# Patient Record
Sex: Male | Born: 1960 | Race: Black or African American | Hispanic: No | Marital: Married | Smoking: Current some day smoker
Health system: Southern US, Community
[De-identification: ages and names within clinical notes are randomized; demographics above are authoritative.]

## PROBLEM LIST (undated history)

## (undated) DIAGNOSIS — T7840XA Allergy, unspecified, initial encounter: Secondary | ICD-10-CM

## (undated) DIAGNOSIS — K219 Gastro-esophageal reflux disease without esophagitis: Secondary | ICD-10-CM

## (undated) DIAGNOSIS — E785 Hyperlipidemia, unspecified: Secondary | ICD-10-CM

## (undated) HISTORY — PX: FOOT SURGERY: SHX648

## (undated) HISTORY — DX: Gastro-esophageal reflux disease without esophagitis: K21.9

## (undated) HISTORY — PX: HAND SURGERY: SHX662

## (undated) HISTORY — DX: Allergy, unspecified, initial encounter: T78.40XA

## (undated) HISTORY — PX: OTHER SURGICAL HISTORY: SHX169

## (undated) HISTORY — DX: Hyperlipidemia, unspecified: E78.5

---

## 2005-06-15 ENCOUNTER — Ambulatory Visit: Payer: Self-pay | Admitting: Nurse Practitioner

## 2005-06-20 ENCOUNTER — Ambulatory Visit (HOSPITAL_COMMUNITY): Admission: RE | Admit: 2005-06-20 | Discharge: 2005-06-20 | Payer: Self-pay | Admitting: Nurse Practitioner

## 2005-06-22 ENCOUNTER — Ambulatory Visit: Payer: Self-pay | Admitting: Nurse Practitioner

## 2005-07-05 ENCOUNTER — Ambulatory Visit: Payer: Self-pay | Admitting: Nurse Practitioner

## 2005-07-20 ENCOUNTER — Encounter: Admission: RE | Admit: 2005-07-20 | Discharge: 2005-10-12 | Payer: Self-pay | Admitting: Family Medicine

## 2005-10-11 ENCOUNTER — Ambulatory Visit: Payer: Self-pay | Admitting: Nurse Practitioner

## 2005-10-28 ENCOUNTER — Ambulatory Visit: Payer: Self-pay | Admitting: Nurse Practitioner

## 2006-03-31 ENCOUNTER — Ambulatory Visit: Payer: Self-pay | Admitting: Nurse Practitioner

## 2006-06-27 ENCOUNTER — Ambulatory Visit: Payer: Self-pay | Admitting: Internal Medicine

## 2006-12-15 ENCOUNTER — Encounter (INDEPENDENT_AMBULATORY_CARE_PROVIDER_SITE_OTHER): Payer: Self-pay | Admitting: Nurse Practitioner

## 2006-12-15 ENCOUNTER — Ambulatory Visit: Payer: Self-pay | Admitting: Family Medicine

## 2006-12-15 LAB — CONVERTED CEMR LAB
ALT: 18 units/L (ref 0–53)
AST: 19 units/L (ref 0–37)
Albumin: 4.3 g/dL (ref 3.5–5.2)
Alkaline Phosphatase: 114 units/L (ref 39–117)
BUN: 12 mg/dL (ref 6–23)
Basophils Absolute: 0.1 10*3/uL (ref 0.0–0.1)
Basophils Relative: 1 % (ref 0–1)
CO2: 24 meq/L (ref 19–32)
Calcium: 9.6 mg/dL (ref 8.4–10.5)
Chloride: 104 meq/L (ref 96–112)
Creatinine, Ser: 1.27 mg/dL (ref 0.40–1.50)
Eosinophils Absolute: 0.2 10*3/uL (ref 0.2–0.7)
Eosinophils Relative: 3 % (ref 0–5)
Glucose, Bld: 90 mg/dL (ref 70–99)
HCT: 43.8 % (ref 39.0–52.0)
Hemoglobin: 14.6 g/dL (ref 13.0–17.0)
Lymphocytes Relative: 30 % (ref 12–46)
Lymphs Abs: 2.4 10*3/uL (ref 0.7–4.0)
MCHC: 33.3 g/dL (ref 30.0–36.0)
MCV: 76.6 fL — ABNORMAL LOW (ref 78.0–100.0)
Monocytes Absolute: 0.8 10*3/uL (ref 0.1–1.0)
Monocytes Relative: 11 % (ref 3–12)
Neutro Abs: 4.4 10*3/uL (ref 1.7–7.7)
Neutrophils Relative %: 55 % (ref 43–77)
Platelets: 318 10*3/uL (ref 150–400)
Potassium: 4.5 meq/L (ref 3.5–5.3)
RBC: 5.72 M/uL (ref 4.22–5.81)
RDW: 15.2 % (ref 11.5–15.5)
Sodium: 139 meq/L (ref 135–145)
TSH: 1.462 microintl units/mL (ref 0.350–5.50)
Total Bilirubin: 0.5 mg/dL (ref 0.3–1.2)
Total Protein: 7.3 g/dL (ref 6.0–8.3)
WBC: 7.9 10*3/uL (ref 4.0–10.5)

## 2007-03-21 ENCOUNTER — Ambulatory Visit: Payer: Self-pay | Admitting: Family Medicine

## 2007-08-02 ENCOUNTER — Ambulatory Visit: Payer: Self-pay | Admitting: Internal Medicine

## 2008-01-16 ENCOUNTER — Ambulatory Visit: Payer: Self-pay | Admitting: Internal Medicine

## 2008-03-12 ENCOUNTER — Encounter
Admission: RE | Admit: 2008-03-12 | Discharge: 2008-03-12 | Payer: Self-pay | Admitting: Physical Medicine & Rehabilitation

## 2008-04-29 ENCOUNTER — Ambulatory Visit: Payer: Self-pay | Admitting: Internal Medicine

## 2009-01-01 ENCOUNTER — Ambulatory Visit: Payer: Self-pay | Admitting: Family Medicine

## 2010-01-25 ENCOUNTER — Encounter: Payer: Self-pay | Admitting: Internal Medicine

## 2012-03-14 ENCOUNTER — Emergency Department (INDEPENDENT_AMBULATORY_CARE_PROVIDER_SITE_OTHER): Payer: Medicare Other

## 2012-03-14 ENCOUNTER — Encounter (HOSPITAL_COMMUNITY): Payer: Self-pay | Admitting: Emergency Medicine

## 2012-03-14 ENCOUNTER — Emergency Department (INDEPENDENT_AMBULATORY_CARE_PROVIDER_SITE_OTHER)
Admission: EM | Admit: 2012-03-14 | Discharge: 2012-03-14 | Disposition: A | Discharge status: Home / Self Care | Payer: Medicare Other | Source: Home / Self Care | Attending: Family Medicine | Admitting: Family Medicine

## 2012-03-14 DIAGNOSIS — S63259A Unspecified dislocation of unspecified finger, initial encounter: Secondary | ICD-10-CM

## 2012-03-14 DIAGNOSIS — S63105A Unspecified dislocation of left thumb, initial encounter: Secondary | ICD-10-CM

## 2012-03-14 MED ORDER — OXYCODONE HCL 5 MG PO TABS: 5.0000 mg | ORAL_TABLET | Freq: Three times a day (TID) | ORAL | Status: AC | PRN

## 2012-03-14 NOTE — ED Provider Notes (Signed)
History     CSN: [id number]  Arrival date & time 03/14/12  1602   First MD Initiated Contact with Patient 03/14/12 1644      Chief Complaint  Patient presents with  . Hand Injury    injury to left thumb about an hour ago when sawing tree limb    (Consider location/radiation/quality/duration/timing/severity/associated sxs/prior treatment) Patient is a 52 y.o. male presenting with hand injury. The history is provided by the patient.  Hand Injury Location:  Finger Time since incident:  2 hours Injury: yes   Mechanism of injury comment:  Hyperext injury to left thumb from tree limb when sawing. Finger location:  L thumb Pain details:    Severity:  Mild   Progression:  Unchanged Chronicity:  New   History reviewed. No pertinent past medical history.  Past Surgical History  Procedure Laterality Date  . Foot surgery      History reviewed. No pertinent family history.  History  Substance Use Topics  . Smoking status: Current Every Day Smoker -- 1.00 packs/day    Types: Cigarettes  . Smokeless tobacco: Not on file  . Alcohol Use: Yes      Review of Systems  Constitutional: Negative.   Musculoskeletal: Positive for joint swelling.  Skin: Negative.     Allergies  Review of patient's allergies indicates no known allergies.  Home Medications  No current outpatient prescriptions on file.  BP 135/82  Pulse 88  Temp(Src) 98.5 F (36.9 C) (Oral)  Resp 20  SpO2 97%  Physical Exam  Nursing note and vitals reviewed. Constitutional: He is oriented to person, place, and time. He appears well-developed and well-nourished.  Musculoskeletal: He exhibits tenderness.       Hands: Neurological: He is alert and oriented to person, place, and time.  Skin: Skin is warm and dry.    ED Course  Procedures (including critical care time)  Labs Reviewed - No data to display Dg Finger Thumb Left  03/14/2012  *RADIOLOGY REPORT*  Clinical Data: Pain post trauma  LEFT THUMB  2+V  Comparison: None.  Findings:  Frontal, oblique, and lateral views were obtained. There is dislocation at the first MCP joint with the proximal and distal phalanges displaced volar and slightly lateral to the first metacarpal.  No fractures appreciated.  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  IMPRESSION: Dislocation of the first MCP joint. No fracture appreciable.   Original Report Authenticated By: Bretta Bang, M.D.      1. Dislocation of thumb, left, initial encounter       MDM  X-rays reviewed and report per radiologist.         Linna Hoff, MD 03/14/12 [phone]

## 2012-03-14 NOTE — ED Notes (Signed)
Placed patient in lobby-waiting for dr Amanda Pea to arrive

## 2012-03-14 NOTE — ED Notes (Signed)
Pt c/o injury to left thumb. Pt states that when sawing a limb another limb swung back and hit into left thumb. Unable to move. Swelling noted. Pt ? If it's dislocated. Incident happened an hour ago.

## 2012-03-14 NOTE — ED Notes (Signed)
Injected with Sensoicaine 0.5% and Xylocaine 2 % prior to reduction. Thumb spica splint applied by Dr. [place] Denmark and his PA with plaster casting.

## 2012-03-14 NOTE — Consult Note (Signed)
  See dictation#[id number] Dominica Severin MD

## 2012-03-15 NOTE — Consult Note (Signed)
NAMECORDARIOUS, Corey Jenkins NO.:  [id number]  MEDICAL RECORD NO.:  [id number]  LOCATION:  [facility]                         FACILITY:  [facility]  PHYSICIAN:  Dionne Ano. Gramig, M.D.DATE OF BIRTH:  09/21/1960  DATE OF CONSULTATION: DATE OF DISCHARGE:  03/14/2012                                CONSULTATION   I had the pleasure to see Corey Jenkins.  He is a pleasant gentleman who presents for evaluation and treatment of upper extremity debridement. Corey Jenkins is doing some hand-held work and subsequently sustained an injury to his left thumb.  The patient has not had discussed this issues at length.  At the present juncture, the patient had a significantly dislocated  left thumb.  He complained a severe pain, he notes moderate distress.  We have reviewed his past medical and surgical history at great length as dilated in the chart.  The patient notes that he has some degree of prior injury to the thumb and does always lay at normally he states.  He denies shortness of breath, nausea, vomiting, fever, or chills.  At present time, the patient is evaluated.  Then, he is noted to be alert and oriented and in no acute distress.  The patient states he has no significant allergies.  His flow sheet, chart and review of systems as well detailed and I have reviewed this at length today.  I should note that he has a past surgical history of foot surgery.  He has no pertinent family history.  He occasionally use alcohol.  He smokes a pack a day and he is a current smoker.  Review of systems is negative other than the thumb.  Skin is negative. The patient has no medicines.  He denies elicit drug use.  PHYSICAL EXAMINATION:  GENERAL:  He is a pleasant male, alert and oriented, in no acute distress. VITAL SIGNS:  Stable. HEENT:  The patient has normal HEENT examination. CHEST:  Clear. EXTREMITIES:  Lower extremity examination is benign.  No evidence of DVT, infection, or dystrophy.  He  has right upper extremity, which is neurovascularly intact.  Deep tendon reflexes throughout the biceps, triceps, and brachioradials.  The patient states that he has normal sensation and refill.  He was found to have an obvious dislocation about the MCP joint, left thumb with significant pain on palpation.  I have reviewed this at length and its findings.  X-rays showed displaced MCP joint, left thumb.  He was given a verbal consent following this intermetacarpal block followed by reduction.  Once he was reduced, postreduction x-rays showed that he had adequate relocation of the thumb.  However, examination under anesthesia revealed a complete ulnar collateral ligament tear as well as block with capsular injury extensive in nature, I pointed this after the patient.  The patient and I discussed the relevant issues, do's and don'ts, and options as well as plans for him.  Following this, he was placed in a splint.  Thus, he underwent reduction, dislocation, __________ evaluation.  We are going to plan for give surgical stabilization in the ulnar collateral ligament with pinning and capture reconstruction as necessary.  He understands the risks and benefits of surgery  including risk of infection, bleeding, anesthesia, damage to normal structures and failure of surgery to accomplish its intended goals of relieving symptoms and restoring function.  With this in mind, he desires to proceed.  We will proceed with __________ and should any problems arise, he will notify us.  It was an absolute pleasure to see him today.  Should any problems arise, he will notify us.  We would like to see him back in the operative theater as discussed.  As the symptoms discussed with the patient and the risks, benefits, do's and don'ts and the fact __________ to hopefully give him a finger to somewhat stiff once it is stable as right now he has a very unstable thumb with significant abnormalities.  It  was an absolute pleasure to see him today.  Do's and don'ts have been discussed and all of his questions were answered.  He left the emergency room in awake, alert and oriented and had no complicating features and is ready to proceed with reconstruction.     Dionne Ano. Amanda Pea, M.D.     [facility]  D:  03/14/2012  T:  03/15/2012  Job:  [id number]

## 2013-04-10 ENCOUNTER — Ambulatory Visit (INDEPENDENT_AMBULATORY_CARE_PROVIDER_SITE_OTHER): Payer: Medicare Other | Admitting: Surgery

## 2013-04-10 ENCOUNTER — Encounter (INDEPENDENT_AMBULATORY_CARE_PROVIDER_SITE_OTHER): Payer: Self-pay

## 2013-04-10 ENCOUNTER — Encounter (INDEPENDENT_AMBULATORY_CARE_PROVIDER_SITE_OTHER): Payer: Self-pay | Admitting: Surgery

## 2013-04-10 VITALS — BP 120/68 | HR 78 | Temp 98.3°F | Resp 18 | Ht 73.0 in | Wt 227.0 lb

## 2013-04-10 DIAGNOSIS — K436 Other and unspecified ventral hernia with obstruction, without gangrene: Secondary | ICD-10-CM

## 2013-04-10 NOTE — Patient Instructions (Signed)
[facility]  HERNIA REPAIR POST OP INSTRUCTIONS  Always review your discharge instruction sheet given to you by the facility where your surgery was performed.  1. A  prescription for pain medication may be given to you upon discharge.  Take your pain medication as prescribed.  If narcotic pain medicine is not needed, then you may take acetaminophen (Tylenol) or ibuprofen (Advil) as needed.  2. Take your usually prescribed medications unless otherwise directed.  3. If you need a refill on your pain medication, please contact your pharmacy.  They will contact our office to request authorization. Prescriptions will not be filled after 5 pm daily or on weekends.  4. You should follow a light diet the first 24 hours after arrival home, such as soup and crackers or toast.  Be sure to include plenty of fluids daily.  Resume your normal diet the day after surgery.  5. Most patients will experience some swelling and bruising around the surgical site.  Ice packs and reclining will help.  Swelling and bruising can take several days to resolve.   6. It is common to experience some constipation if taking pain medication after surgery.  Increasing fluid intake and taking a stool softener (such as Colace) will usually help or prevent this problem from occurring.  A mild laxative (Milk of Magnesia or Miralax) should be taken according to package directions if there are no bowel movements after 48 hours.  7. Unless discharge instructions indicate otherwise, you may remove your bandages 24-48 hours after surgery, and you may shower at that time.  You may have steri-strips (small skin tapes) in place directly over the incision.  These strips should be left on the skin for 7-10 days.  If your surgeon used skin glue on the incision, you may shower in 24 hours.  The glue will flake off over the next 2-3 weeks.  Any sutures or staples will be removed at the office during your follow-up  visit.  8. ACTIVITIES:  You may resume regular (light) daily activities beginning the next day-such as daily self-care, walking, climbing stairs-gradually increasing activities as tolerated.  You may have sexual intercourse when it is comfortable.  Refrain from any heavy lifting or straining until approved by your doctor.  You may drive when you are no longer taking prescription pain medication, you can comfortably wear a seatbelt, and you can safely maneuver your car and apply brakes.  9. You should see your doctor in the office for a follow-up appointment approximately 2-3 weeks after your surgery.  Make sure that you call for this appointment within a day or two after you arrive home to insure a convenient appointment time. 10.   WHEN TO CALL YOUR DOCTOR: 1. Fever greater than 101.0 2. Inability to urinate 3. Persistent nausea and/or vomiting 4. Extreme swelling or bruising 5. Continued bleeding from incision 6. Increased pain, redness, or drainage from the incision  The clinic staff is available to answer your questions during regular business hours.  Please don't hesitate to call and ask to speak to one of the nurses for clinical concerns.  If you have a medical emergency, go to the nearest emergency room or call 911.  A surgeon from [facility] is always on call for the hospital.   [facility] [address], [address], [place], [place]  [address]  [phone] ? [phone] ? FAX [phone]  [email or web address]   

## 2013-04-10 NOTE — Progress Notes (Signed)
General Surgery [facility]  Chief Complaint  Patient presents with  . New Evaluation    evaluate ventral hernia - referral from Dr. Leilani Able and Dr. Alwyn Pea    HISTORY: Patient is a 53 year old male referred by his primary care physician with a newly diagnosed ventral hernia. Patient states this is been present for a few years. It has gradually increased in size. It is more prominent with physical activity. He has intermittent discomfort. He has noted no significant change in his bowel movements. He has had no prior abdominal surgery.  Past Medical History  Diagnosis Date  . GERD (gastroesophageal reflux disease)     Current Outpatient Prescriptions  Medication Sig Dispense Refill  . omeprazole (PRILOSEC) 20 MG capsule Take 20 mg by mouth daily.       No current facility-administered medications for this visit.    No Known Allergies  History reviewed. No pertinent family history.  History   Social History  . Marital Status: Married    Spouse Name: N/A    Number of Children: N/A  . Years of Education: N/A   Social History Main Topics  . Smoking status: Current Every Day Smoker -- 1.00 packs/day    Types: Cigarettes  . Smokeless tobacco: None  . Alcohol Use: Yes  . Drug Use: No  . Sexual Activity: Yes    Birth Control/ Protection: Condom   Other Topics Concern  . None   Social History Narrative  . None    REVIEW OF SYSTEMS - PERTINENT POSITIVES ONLY: Denies signs or symptoms of obstruction. Intermittent discomfort. Never fully reducible.  EXAM: Filed Vitals:   04/10/13 0908  BP: 120/68  Pulse: 78  Temp: 98.3 F (36.8 C)  Resp: 18    GENERAL: well-developed, well-nourished, no acute distress HEENT: normocephalic; pupils equal and reactive; sclerae clear; dentition good; mucous membranes moist NECK:  symmetric on extension; no palpable anterior or posterior cervical lymphadenopathy; no supraclavicular masses; no  tenderness CHEST: clear to auscultation bilaterally without rales, rhonchi, or wheezes CARDIAC: regular rate and rhythm without significant murmur; peripheral pulses are full ABDOMEN: soft without distension; bowel sounds present; no mass; no hepatosplenomegaly; no surgical incisions; palpable soft tissue mass in the midline above the level of umbilicus consistent with a ventral hernia; with manipulation this is partially reducible but not fully reducible and does cause moderate discomfort; tiny umbilical hernia is present EXT:  non-tender without edema; left foot amputation by history NEURO: no gross focal deficits; no sign of tremor   LABORATORY RESULTS: See [facility] (CHL-Epic) for most recent results  RADIOLOGY RESULTS: See [facility] (CHL-Epic) for most recent results  IMPRESSION: Ventral hernia, moderate, incarcerated  PLAN: I discussed the above findings with the patient. I provided him with written literature to review at home. I have recommended repair by open technique with the use of a mesh patch inserted behind the musculature. We discussed the risk and benefits of the procedure. We discussed restrictions on his activities following surgery. We will arrange for outpatient surgery at a time convenient for the patient in the near future. Patient understands and agrees to proceed.  The risks and benefits of the procedure have been discussed at length with the patient.  The patient understands the proposed procedure, potential alternative treatments, and the course of recovery to be expected.  All of the patient's questions have been answered at this time.  The patient wishes to proceed with surgery.  Velora Heckler, MD,  FACS General & Endocrine Surgery [facility]  Primary Care Physician: Altamese CarolinaMARTIN,TANYA D, MD

## 2013-04-20 ENCOUNTER — Telehealth (INDEPENDENT_AMBULATORY_CARE_PROVIDER_SITE_OTHER): Payer: Self-pay | Admitting: General Surgery

## 2013-04-20 NOTE — Telephone Encounter (Signed)
LMOM for patient to let him know that he has a follow apt with Dr Gerrit FriendsGerkin on 05-07-13. And mailed out letter, and if patient calls back that day does not work he needs to know that Dr Gerrit FriendsGerkin is out 3 weeks in May.

## 2013-05-07 ENCOUNTER — Encounter (INDEPENDENT_AMBULATORY_CARE_PROVIDER_SITE_OTHER): Payer: Medicare Other | Admitting: Surgery

## 2013-05-16 ENCOUNTER — Encounter (INDEPENDENT_AMBULATORY_CARE_PROVIDER_SITE_OTHER): Payer: Self-pay

## 2013-07-20 ENCOUNTER — Encounter (HOSPITAL_COMMUNITY): Payer: Self-pay | Admitting: Emergency Medicine

## 2013-07-20 ENCOUNTER — Emergency Department (HOSPITAL_COMMUNITY)
Admission: EM | Admit: 2013-07-20 | Discharge: 2013-07-20 | Disposition: A | Discharge status: Home / Self Care | Payer: Medicare Other | Attending: Emergency Medicine | Admitting: Emergency Medicine

## 2013-07-20 DIAGNOSIS — H1131 Conjunctival hemorrhage, right eye: Secondary | ICD-10-CM

## 2013-07-20 DIAGNOSIS — Y9269 Other specified industrial and construction area as the place of occurrence of the external cause: Secondary | ICD-10-CM | POA: Insufficient documentation

## 2013-07-20 DIAGNOSIS — F17200 Nicotine dependence, unspecified, uncomplicated: Secondary | ICD-10-CM | POA: Insufficient documentation

## 2013-07-20 DIAGNOSIS — H5710 Ocular pain, unspecified eye: Secondary | ICD-10-CM | POA: Diagnosis present

## 2013-07-20 DIAGNOSIS — X58XXXA Exposure to other specified factors, initial encounter: Secondary | ICD-10-CM | POA: Insufficient documentation

## 2013-07-20 DIAGNOSIS — Y9389 Activity, other specified: Secondary | ICD-10-CM | POA: Insufficient documentation

## 2013-07-20 DIAGNOSIS — K219 Gastro-esophageal reflux disease without esophagitis: Secondary | ICD-10-CM | POA: Insufficient documentation

## 2013-07-20 DIAGNOSIS — S0501XA Injury of conjunctiva and corneal abrasion without foreign body, right eye, initial encounter: Secondary | ICD-10-CM

## 2013-07-20 DIAGNOSIS — Y990 Civilian activity done for income or pay: Secondary | ICD-10-CM | POA: Insufficient documentation

## 2013-07-20 DIAGNOSIS — Z79899 Other long term (current) drug therapy: Secondary | ICD-10-CM | POA: Insufficient documentation

## 2013-07-20 DIAGNOSIS — S0500XA Injury of conjunctiva and corneal abrasion without foreign body, unspecified eye, initial encounter: Secondary | ICD-10-CM | POA: Diagnosis not present

## 2013-07-20 DIAGNOSIS — H1130 Conjunctival hemorrhage, unspecified eye: Secondary | ICD-10-CM | POA: Diagnosis not present

## 2013-07-20 MED ORDER — KETOROLAC TROMETHAMINE 0.5 % OP SOLN: 1.0000 [drp] | Freq: Four times a day (QID) | OPHTHALMIC | Status: DC

## 2013-07-20 MED ORDER — FLUORESCEIN SODIUM 1 MG OP STRP
1.0000 | ORAL_STRIP | Freq: Once | OPHTHALMIC | Status: AC
  Administered 2013-07-20: 1 via OPHTHALMIC
  Filled 2013-07-20: qty 1

## 2013-07-20 MED ORDER — POLYMYXIN B-TRIMETHOPRIM 10000-0.1 UNIT/ML-% OP SOLN: 1.0000 [drp] | OPHTHALMIC | Status: DC

## 2013-07-20 MED ORDER — TETRACAINE HCL 0.5 % OP SOLN
1.0000 [drp] | Freq: Once | OPHTHALMIC | Status: AC
  Administered 2013-07-20: 1 [drp] via OPHTHALMIC
  Filled 2013-07-20: qty 2

## 2013-07-20 NOTE — ED Provider Notes (Signed)
CSN: [id number]     Arrival date & time 07/20/13  1350 History  This chart was scribed for non-physician practitioner, Sharilyn Sites, PA-C,working with Richardean Canal, MD, by Karle Plumber, ED Scribe.  This patient was seen in room TR04C/TR04C and the patient's care was started at 2:35 PM.  Chief Complaint  Patient presents with  . Eye Pain   The history is provided by the patient. No language interpreter was used.   HPI Comments:  Corey Jenkins is a 53 y.o. male who presents to the Emergency Department complaining of right eye redness and irritation upon waking two days ago. 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 He reports intermittent blurred vision when he has tearing of the eye. He denies vision loss, fever, chills or photophobia. He denies wearing corrective lenses or contact lenses. He does not have an ophthalmologist.   Past Medical History  Diagnosis Date  . GERD (gastroesophageal reflux disease)    Past Surgical History  Procedure Laterality Date  . Foot surgery    . Hand surgery     History reviewed. No pertinent family history. History  Substance Use Topics  . Smoking status: Current Every Day Smoker -- 1.00 packs/day    Types: Cigarettes  . Smokeless tobacco: Not on file  . Alcohol Use: Yes    Review of Systems  Constitutional: Negative for fever and chills.  Eyes: Positive for pain and redness. Negative for photophobia.  All other systems reviewed and are negative.   Allergies  Review of patient's allergies indicates no known allergies.  Home Medications   Prior to Admission medications   Medication Sig Start Date End Date Taking? Authorizing Provider  naproxen sodium (ANAPROX) 220 MG tablet Take 220 mg by mouth 2 (two) times daily as needed (for pain).   Yes Historical Provider, MD  ranitidine (ZANTAC) 75 MG tablet Take 75 mg by mouth 2 (two) times daily.   Yes Historical Provider, MD  terbinafine (LAMISIL)  250 MG tablet Take 250 mg by mouth daily.    Historical Provider, MD   Triage Vitals: BP 116/75  Pulse 85  Temp(Src) 98.8 F (37.1 C) (Oral)  Resp 20  Ht 6\' 1"  (1.854 m)  Wt 220 lb (99.791 kg)  BMI 29.03 kg/m2  SpO2 98% Physical Exam  Nursing note and vitals reviewed. Constitutional: He is oriented to person, place, and time. He appears well-developed and well-nourished.  HENT:  Head: Normocephalic and atraumatic.  Mouth/Throat: Oropharynx is clear and moist.  Eyes: EOM and lids are normal. Pupils are equal, round, and reactive to light. No foreign body present in the right eye. Right conjunctiva is injected. Right conjunctiva has a hemorrhage.  Slit lamp exam:      The right eye shows no corneal abrasion, no corneal flare, no corneal ulcer and no foreign body.  Right conjunctiva injected with small hemorrhage present along lateral aspect; no lid edema or erythema; EOM intact and non-painful; negative fluorescein uptake, no corneal ulcer or abrasion; small conjunctival abrasion noted along lateral aspect near sit of hemorrhage; no FB noted  Neck: Normal range of motion.  Cardiovascular: Normal rate, regular rhythm and normal heart sounds.   Pulmonary/Chest: Effort normal and breath sounds normal.  Abdominal: Soft. Bowel sounds are normal.  Musculoskeletal: Normal range of motion.  Neurological: He is alert and oriented to person, place, and time.  Skin: Skin is warm and dry.  Psychiatric: He  has a normal mood and affect.    ED Course  Procedures (including critical care time) DIAGNOSTIC STUDIES: Oxygen Saturation is 98% on RA, normal by my interpretation.   COORDINATION OF CARE: 2:39 PM- Will prescribe antibiotic drops for abrasion. Pt verbalizes understanding and agrees to plan.  Medications  fluorescein ophthalmic strip 1 strip (not administered)  tetracaine (PONTOCAINE) 0.5 % ophthalmic solution 1 drop (not administered)    Labs Review Labs Reviewed - No data to  display  Imaging Review No results found.   EKG Interpretation None      MDM   Final diagnoses:  Conjunctival abrasion, right, initial encounter  Conjunctival hemorrhage, right   Conjunctival abrasion and small hemorrhage.  No current visual disturbance.  Will start on polytrim drops and acular for comfort.  FU with opthalmology.  Discussed plan with patient, he/she acknowledged understanding and agreed with plan of care.  Return precautions given for new or worsening symptoms.  I personally performed the services described in this documentation, which was scribed in my presence. The recorded information has been reviewed and is accurate.  Garlon HatchetLisa M Hassaan Crite, PA-C 07/20/13 1512

## 2013-07-20 NOTE — ED Notes (Signed)
Started 2 days ago with right eye redness and irritation. 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

## 2013-07-20 NOTE — Discharge Instructions (Signed)
Take the prescribed medication as directed. Follow-up with Dr. Gwen PoundsKowalski if symptoms worsen or begin experiencing trouble with your vision. Return to the ED for new concerns.

## 2013-07-21 NOTE — ED Provider Notes (Signed)
Medical screening examination/treatment/procedure(s) were performed by non-physician practitioner and as supervising physician I was immediately available for consultation/collaboration.   EKG Interpretation None        David H Yao, MD 07/21/13 1059 

## 2015-10-14 ENCOUNTER — Encounter (HOSPITAL_COMMUNITY): Payer: Self-pay | Admitting: Emergency Medicine

## 2015-10-14 ENCOUNTER — Ambulatory Visit (HOSPITAL_COMMUNITY)
Admission: EM | Admit: 2015-10-14 | Discharge: 2015-10-14 | Disposition: A | Discharge status: Home / Self Care | Payer: Commercial Managed Care - HMO | Attending: Emergency Medicine | Admitting: Emergency Medicine

## 2015-10-14 DIAGNOSIS — K068 Other specified disorders of gingiva and edentulous alveolar ridge: Secondary | ICD-10-CM

## 2015-10-14 MED ORDER — CHLORHEXIDINE GLUCONATE 0.12% ORAL RINSE (MEDLINE KIT): 15.0000 mL | Freq: Two times a day (BID) | OROMUCOSAL | 0 refills | Status: AC

## 2015-10-14 MED ORDER — AMOXICILLIN-POT CLAVULANATE 875-125 MG PO TABS: 1.0000 | ORAL_TABLET | Freq: Two times a day (BID) | ORAL | 0 refills | Status: DC

## 2015-10-14 NOTE — Discharge Instructions (Signed)
°  Patients with Medicaid: [facility] °[address], [phone] °[address], [phone] ° °If unable to pay, or uninsured, contact [facility] ([phone]) or [facility] ([phone] in [place], [phone] in [place]) to become qualified for the adult dental clinic ° °Other Low-Cost Community [facility] Services: °[facility]- [address], [place], [place], [address] °   [phone], Ext. [phone] °   2nd and 4th Thursday of the month at 6:30am °   10 clients each day by appointment, can sometimes see walk-in patients if someone does not show for an appointment °[facility]- [address], [place], [place], [address] °   [phone] °[facility]- [address], [place], , [address] °   [phone] ° °[facility]- [phone] °[facility]- [phone] ° [facility]- [phone] ° °

## 2015-10-14 NOTE — ED Provider Notes (Signed)
CSN: [id number]     Arrival date & time 10/14/15  1445 History   First MD Initiated Contact with Patient 10/14/15 1550     Chief Complaint  Patient presents with  . Dental Pain   (Consider location/radiation/quality/duration/timing/severity/associated sxs/prior Treatment) HPI Corey Jenkins is a 55 y.o. male presenting to UC with c/o Left upper gum pain that started about 1 week ago.  He believes it may be due to an abscess in his gums.  Pain is aching and sore, 4/10, worse with chewing and palpation. Denies bleeding or drainage from his gums. He has tried salt water gargles w/o relief. Denies fever, chills, n/v/d.    Past Medical History:  Diagnosis Date  . GERD (gastroesophageal reflux disease)    Past Surgical History:  Procedure Laterality Date  . FOOT SURGERY    . HAND SURGERY     History reviewed. No pertinent family history. Social History  Substance Use Topics  . Smoking status: Current Every Day Smoker    Packs/day: 1.00    Types: Cigarettes  . Smokeless tobacco: Not on file  . Alcohol use Yes    Review of Systems  Constitutional: Negative for chills and fever.  HENT: Positive for dental problem. Negative for facial swelling, mouth sores and sore throat.   Gastrointestinal: Negative for abdominal pain, diarrhea, nausea and vomiting.    Allergies  Review of patient's allergies indicates no known allergies.  Home Medications   Prior to Admission medications   Medication Sig Start Date End Date Taking? Authorizing Provider  montelukast (SINGULAIR) 10 MG tablet Take 10 mg by mouth at bedtime.   Yes Historical Provider, MD  naproxen sodium (ANAPROX) 220 MG tablet Take 220 mg by mouth 2 (two) times daily as needed (for pain).   Yes Historical Provider, MD  amoxicillin-clavulanate (AUGMENTIN) 875-125 MG tablet Take 1 tablet by mouth 2 (two) times daily. One po bid x 7 days 10/14/15   Noland Fordyce, PA-C  chlorhexidine gluconate, MEDLINE KIT, (PERIDEX) 0.12 % solution  Use as directed 15 mLs in the mouth or throat 2 (two) times daily. Swish in mouth for 30 seconds then spit 10/14/15   Noland Fordyce, PA-C  ketorolac (ACULAR) 0.5 % ophthalmic solution Place 1 drop into the right eye 4 (four) times daily. 07/20/13   Larene Pickett, PA-C  ranitidine (ZANTAC) 75 MG tablet Take 75 mg by mouth 2 (two) times daily.    Historical Provider, MD  terbinafine (LAMISIL) 250 MG tablet Take 250 mg by mouth daily.    Historical Provider, MD  trimethoprim-polymyxin b (POLYTRIM) ophthalmic solution Place 1 drop into the right eye every 4 (four) hours. 07/20/13   Larene Pickett, PA-C   Meds Ordered and Administered this Visit  Medications - No data to display  BP 129/89 (BP Location: Right Arm)   Pulse (!) 59   Temp 98.2 F (36.8 C) (Oral)   Resp 18   SpO2 100%  No data found.   Physical Exam  Constitutional: He is oriented to person, place, and time. He appears well-developed and well-nourished. No distress.  HENT:  Head: Normocephalic and atraumatic.  Nose: Nose normal.  Mouth/Throat: Oropharynx is clear and moist and mucous membranes are normal. No oral lesions. No trismus in the jaw. Abnormal dentition. No dental abscesses or uvula swelling.  Multiple missing teeth on Left upper jaw. Gingiva- mild erythema and edema, tender. No fluctuance. No bleeding or discharge. No facial edema or erythema.   Eyes: EOM are normal.  Neck: Normal range of motion. Neck supple.  Cardiovascular: Normal rate.   Pulmonary/Chest: Effort normal. No stridor. No respiratory distress.  Musculoskeletal: Normal range of motion.  Neurological: He is alert and oriented to person, place, and time.  Skin: Skin is warm and dry. He is not diaphoretic.  Psychiatric: He has a normal mood and affect. His behavior is normal.  Nursing note and vitals reviewed.   Urgent Care Course   Clinical Course    Procedures (including critical care time)  Labs Review Labs Reviewed - No data to  display  Imaging Review No results found.   MDM   1. Pain in gums    Pt c/o pain in his gums.   No gross gingival abscess noted on exam. No indication for I&D at this time. Will cover for underlying bacterial infection.  Rx: Augmentin and chlorhexidine mouth rinse.  Encouraged f/u with Dentist for further evaluation and treatment of symptoms. Resource guide provided.    Noland Fordyce, PA-C 10/14/15 [phone]

## 2015-10-14 NOTE — ED Triage Notes (Signed)
[facility] a [facility] of gum pain x 1 week that he believes to be an abscess on his gums.

## 2016-01-04 ENCOUNTER — Encounter (HOSPITAL_COMMUNITY): Payer: Self-pay | Admitting: Emergency Medicine

## 2016-01-04 ENCOUNTER — Emergency Department (HOSPITAL_COMMUNITY): Payer: Commercial Managed Care - HMO

## 2016-01-04 ENCOUNTER — Emergency Department (HOSPITAL_COMMUNITY)
Admission: EM | Admit: 2016-01-04 | Discharge: 2016-01-04 | Disposition: A | Discharge status: Home / Self Care | Payer: Commercial Managed Care - HMO | Attending: Emergency Medicine | Admitting: Emergency Medicine

## 2016-01-04 DIAGNOSIS — S70211A Abrasion, right hip, initial encounter: Secondary | ICD-10-CM | POA: Diagnosis not present

## 2016-01-04 DIAGNOSIS — S50311A Abrasion of right elbow, initial encounter: Secondary | ICD-10-CM | POA: Diagnosis not present

## 2016-01-04 DIAGNOSIS — R52 Pain, unspecified: Secondary | ICD-10-CM

## 2016-01-04 DIAGNOSIS — S90511A Abrasion, right ankle, initial encounter: Secondary | ICD-10-CM | POA: Insufficient documentation

## 2016-01-04 DIAGNOSIS — Y999 Unspecified external cause status: Secondary | ICD-10-CM | POA: Diagnosis not present

## 2016-01-04 DIAGNOSIS — S99911A Unspecified injury of right ankle, initial encounter: Secondary | ICD-10-CM | POA: Diagnosis present

## 2016-01-04 DIAGNOSIS — Y92410 Unspecified street and highway as the place of occurrence of the external cause: Secondary | ICD-10-CM | POA: Insufficient documentation

## 2016-01-04 DIAGNOSIS — Y939 Activity, unspecified: Secondary | ICD-10-CM | POA: Diagnosis not present

## 2016-01-04 DIAGNOSIS — S60811A Abrasion of right wrist, initial encounter: Secondary | ICD-10-CM | POA: Diagnosis not present

## 2016-01-04 DIAGNOSIS — S80211A Abrasion, right knee, initial encounter: Secondary | ICD-10-CM | POA: Diagnosis not present

## 2016-01-04 DIAGNOSIS — F17210 Nicotine dependence, cigarettes, uncomplicated: Secondary | ICD-10-CM | POA: Insufficient documentation

## 2016-01-04 LAB — CBC WITH DIFFERENTIAL/PLATELET
Basophils Absolute: 0 10*3/uL (ref 0.0–0.1)
Basophils Relative: 0 %
Eosinophils Absolute: 0.1 10*3/uL (ref 0.0–0.7)
Eosinophils Relative: 1 %
HCT: 42.1 % (ref 39.0–52.0)
Hemoglobin: 14.8 g/dL (ref 13.0–17.0)
Lymphocytes Relative: 14 %
Lymphs Abs: 1.8 10*3/uL (ref 0.7–4.0)
MCH: 25.9 pg — ABNORMAL LOW (ref 26.0–34.0)
MCHC: 35.2 g/dL (ref 30.0–36.0)
MCV: 73.7 fL — ABNORMAL LOW (ref 78.0–100.0)
Monocytes Absolute: 2 10*3/uL — ABNORMAL HIGH (ref 0.1–1.0)
Monocytes Relative: 15 %
Neutro Abs: 9.2 10*3/uL — ABNORMAL HIGH (ref 1.7–7.7)
Neutrophils Relative %: 70 %
Platelets: 286 10*3/uL (ref 150–400)
RBC: 5.71 MIL/uL (ref 4.22–5.81)
RDW: 14.6 % (ref 11.5–15.5)
WBC: 13.1 10*3/uL — ABNORMAL HIGH (ref 4.0–10.5)

## 2016-01-04 LAB — BASIC METABOLIC PANEL
Anion gap: 11 (ref 5–15)
BUN: 10 mg/dL (ref 6–20)
CO2: 24 mmol/L (ref 22–32)
Calcium: 9.7 mg/dL (ref 8.9–10.3)
Chloride: 102 mmol/L (ref 101–111)
Creatinine, Ser: 1.32 mg/dL — ABNORMAL HIGH (ref 0.61–1.24)
GFR calc Af Amer: >60 mL/min (ref >60)
GFR calc non Af Amer: 59 mL/min — ABNORMAL LOW (ref >60)
Glucose, Bld: 95 mg/dL (ref 65–99)
Potassium: 3.7 mmol/L (ref 3.5–5.1)
Sodium: 137 mmol/L (ref 135–145)

## 2016-01-04 MED ORDER — HYDROMORPHONE HCL 2 MG/ML IJ SOLN
0.5000 mg | Freq: Once | INTRAMUSCULAR | Status: AC
  Administered 2016-01-04: 0.5 mg via INTRAVENOUS

## 2016-01-04 MED ORDER — HYDROMORPHONE HCL 2 MG/ML IJ SOLN
0.5000 mg | Freq: Two times a day (BID) | INTRAMUSCULAR | Status: DC | PRN
  Administered 2016-01-04: 0.5 mg via INTRAVENOUS
  Filled 2016-01-04: qty 1

## 2016-01-04 MED ORDER — HYDROMORPHONE HCL 2 MG/ML IJ SOLN
1.0000 mg | Freq: Once | INTRAMUSCULAR | Status: DC
  Filled 2016-01-04: qty 1

## 2016-01-04 MED ORDER — MORPHINE SULFATE (PF) 4 MG/ML IV SOLN
4.0000 mg | Freq: Once | INTRAVENOUS | Status: DC
  Filled 2016-01-04: qty 1

## 2016-01-04 MED ORDER — OXYCODONE-ACETAMINOPHEN 5-325 MG PO TABS: 1.0000 | ORAL_TABLET | ORAL | 0 refills | Status: DC | PRN

## 2016-01-04 NOTE — ED Notes (Signed)
Ortho at bedside.

## 2016-01-04 NOTE — ED Notes (Signed)
Patient in x-ray, to be wheeled to room after.

## 2016-01-04 NOTE — ED Notes (Signed)
Pt returned to room and placed back on monitor.  

## 2016-01-04 NOTE — ED Notes (Signed)
Patient verbalized understanding of discharge instructions and denies any further needs or questions at this time. VS stable. Patient ambulatory with steady gait and use of crutches. Escorted to ED entrance in wheelchair.

## 2016-01-04 NOTE — Progress Notes (Signed)
Orthopedic Tech Progress Note Patient Details:  Corey Jenkins 1960-06-05 [id number]  Ortho Devices Type of Ortho Device: Crutches, Finger splint, Postop shoe/boot, Velcro wrist splint, Ace wrap Ortho Device/Splint Location: rue third finger splint and velcro wrist splint. rle ankle ace wrap and post op shoe.  Ortho Device/Splint Interventions: Ordered, Application   Trinna PostMartinez, Mareena Cavan J 01/04/2016, 11:22 PM
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
## 2016-01-04 NOTE — Discharge Instructions (Signed)
Take percocet for breakthrough pain, do not drink alcohol, drive, care for children or do other critical tasks while taking percocet.  Wash the affected area with soap and water and apply a thin layer of topical antibiotic ointment. Do this every 12 hours.   Do not use rubbing alcohol or hydrogen peroxide.                        Look for signs of infection: if you see redness, if the area becomes warm, if pain increases sharply, there is discharge (pus), if red streaks appear or you develop fever or vomiting, RETURN immediately to the Emergency Department  for a recheck.   Please follow with your primary care doctor in the next 2 days for a check-up. They must obtain records for further management.   Do not hesitate to return to the Emergency Department for any new, worsening or concerning symptoms.

## 2016-01-04 NOTE — ED Notes (Signed)
Pt transported to MRI 

## 2016-01-12 ENCOUNTER — Telehealth (INDEPENDENT_AMBULATORY_CARE_PROVIDER_SITE_OTHER): Payer: Self-pay | Admitting: Radiology
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
## 2016-01-12 NOTE — Telephone Encounter (Signed)
Patient called back and now has an appt

## 2016-01-13 ENCOUNTER — Ambulatory Visit (INDEPENDENT_AMBULATORY_CARE_PROVIDER_SITE_OTHER): Payer: Medicare Other | Admitting: Orthopaedic Surgery

## 2016-01-13 DIAGNOSIS — S6000XA Contusion of unspecified finger without damage to nail, initial encounter: Secondary | ICD-10-CM | POA: Diagnosis not present

## 2016-01-13 MED ORDER — TIZANIDINE HCL 4 MG PO TABS: 4.0000 mg | ORAL_TABLET | Freq: Three times a day (TID) | ORAL | 0 refills | Status: DC | PRN

## 2016-01-13 MED ORDER — TRAMADOL HCL 50 MG PO TABS: 100.0000 mg | ORAL_TABLET | Freq: Two times a day (BID) | ORAL | 0 refills | Status: DC | PRN
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
## 2016-02-03 ENCOUNTER — Ambulatory Visit (INDEPENDENT_AMBULATORY_CARE_PROVIDER_SITE_OTHER): Payer: Medicare Other | Admitting: Orthopaedic Surgery

## 2016-02-09 ENCOUNTER — Ambulatory Visit (INDEPENDENT_AMBULATORY_CARE_PROVIDER_SITE_OTHER): Payer: Medicare Other | Admitting: Orthopaedic Surgery

## 2016-02-18 ENCOUNTER — Ambulatory Visit (INDEPENDENT_AMBULATORY_CARE_PROVIDER_SITE_OTHER): Payer: Medicare Other | Admitting: Orthopaedic Surgery

## 2016-02-18 DIAGNOSIS — S6000XS Contusion of unspecified finger without damage to nail, sequela: Secondary | ICD-10-CM | POA: Diagnosis not present

## 2016-02-18 MED ORDER — METHYLPREDNISOLONE 4 MG PO TABS: ORAL_TABLET | ORAL | 0 refills | Status: DC

## 2016-02-18 MED ORDER — ETODOLAC 500 MG PO TABS: 500.0000 mg | ORAL_TABLET | Freq: Two times a day (BID) | ORAL | 3 refills | Status: DC
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
## 2016-02-26 DIAGNOSIS — Z131 Encounter for screening for diabetes mellitus: Secondary | ICD-10-CM | POA: Diagnosis not present

## 2016-02-26 DIAGNOSIS — Z01118 Encounter for examination of ears and hearing with other abnormal findings: Secondary | ICD-10-CM | POA: Diagnosis not present

## 2016-02-26 DIAGNOSIS — Z0000 Encounter for general adult medical examination without abnormal findings: Secondary | ICD-10-CM | POA: Diagnosis not present

## 2016-02-26 DIAGNOSIS — H538 Other visual disturbances: Secondary | ICD-10-CM | POA: Diagnosis not present

## 2016-02-26 DIAGNOSIS — Z720 Tobacco use: Secondary | ICD-10-CM | POA: Diagnosis not present

## 2016-02-26 DIAGNOSIS — R1013 Epigastric pain: Secondary | ICD-10-CM | POA: Diagnosis not present

## 2016-02-26 DIAGNOSIS — Z5181 Encounter for therapeutic drug level monitoring: Secondary | ICD-10-CM | POA: Diagnosis not present

## 2016-02-26 DIAGNOSIS — E785 Hyperlipidemia, unspecified: Secondary | ICD-10-CM | POA: Diagnosis not present

## 2016-03-03 ENCOUNTER — Ambulatory Visit (INDEPENDENT_AMBULATORY_CARE_PROVIDER_SITE_OTHER): Payer: Medicare Other | Admitting: Physician Assistant

## 2016-03-03 ENCOUNTER — Ambulatory Visit (INDEPENDENT_AMBULATORY_CARE_PROVIDER_SITE_OTHER): Payer: Medicare Other | Admitting: Orthopaedic Surgery

## 2016-03-03 DIAGNOSIS — M79641 Pain in right hand: Secondary | ICD-10-CM | POA: Diagnosis not present
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
## 2016-08-04 DIAGNOSIS — L97511 Non-pressure chronic ulcer of other part of right foot limited to breakdown of skin: Secondary | ICD-10-CM | POA: Diagnosis not present

## 2016-08-04 DIAGNOSIS — G5761 Lesion of plantar nerve, right lower limb: Secondary | ICD-10-CM | POA: Diagnosis not present

## 2016-08-04 DIAGNOSIS — M2041 Other hammer toe(s) (acquired), right foot: Secondary | ICD-10-CM | POA: Diagnosis not present

## 2016-08-04 DIAGNOSIS — M7751 Other enthesopathy of right foot: Secondary | ICD-10-CM | POA: Diagnosis not present

## 2017-02-17 DIAGNOSIS — H538 Other visual disturbances: Secondary | ICD-10-CM | POA: Diagnosis not present

## 2017-02-17 DIAGNOSIS — Z136 Encounter for screening for cardiovascular disorders: Secondary | ICD-10-CM | POA: Diagnosis not present

## 2017-02-17 DIAGNOSIS — Z131 Encounter for screening for diabetes mellitus: Secondary | ICD-10-CM | POA: Diagnosis not present

## 2017-02-17 DIAGNOSIS — Z0000 Encounter for general adult medical examination without abnormal findings: Secondary | ICD-10-CM | POA: Diagnosis not present

## 2017-02-17 DIAGNOSIS — Z5181 Encounter for therapeutic drug level monitoring: Secondary | ICD-10-CM | POA: Diagnosis not present

## 2017-02-17 DIAGNOSIS — Z01118 Encounter for examination of ears and hearing with other abnormal findings: Secondary | ICD-10-CM | POA: Diagnosis not present

## 2017-02-23 DIAGNOSIS — J3489 Other specified disorders of nose and nasal sinuses: Secondary | ICD-10-CM | POA: Diagnosis not present

## 2017-03-28 DIAGNOSIS — Z720 Tobacco use: Secondary | ICD-10-CM | POA: Diagnosis not present

## 2017-03-28 DIAGNOSIS — R1013 Epigastric pain: Secondary | ICD-10-CM | POA: Diagnosis not present

## 2017-03-28 DIAGNOSIS — J309 Allergic rhinitis, unspecified: Secondary | ICD-10-CM | POA: Diagnosis not present

## 2017-03-28 DIAGNOSIS — E785 Hyperlipidemia, unspecified: Secondary | ICD-10-CM | POA: Diagnosis not present

## 2017-03-28 DIAGNOSIS — Z0000 Encounter for general adult medical examination without abnormal findings: Secondary | ICD-10-CM | POA: Diagnosis not present

## 2017-04-06 DIAGNOSIS — Z1212 Encounter for screening for malignant neoplasm of rectum: Secondary | ICD-10-CM | POA: Diagnosis not present

## 2017-04-06 DIAGNOSIS — Z1211 Encounter for screening for malignant neoplasm of colon: Secondary | ICD-10-CM | POA: Diagnosis not present

## 2017-07-18 DIAGNOSIS — J309 Allergic rhinitis, unspecified: Secondary | ICD-10-CM | POA: Diagnosis not present

## 2017-07-18 DIAGNOSIS — N289 Disorder of kidney and ureter, unspecified: Secondary | ICD-10-CM | POA: Diagnosis not present

## 2017-07-18 DIAGNOSIS — E785 Hyperlipidemia, unspecified: Secondary | ICD-10-CM | POA: Diagnosis not present

## 2017-07-18 DIAGNOSIS — R1013 Epigastric pain: Secondary | ICD-10-CM | POA: Diagnosis not present

## 2017-09-09 DIAGNOSIS — J0100 Acute maxillary sinusitis, unspecified: Secondary | ICD-10-CM | POA: Diagnosis not present

## 2017-09-09 DIAGNOSIS — J309 Allergic rhinitis, unspecified: Secondary | ICD-10-CM | POA: Diagnosis not present

## 2017-09-09 DIAGNOSIS — Z720 Tobacco use: Secondary | ICD-10-CM | POA: Diagnosis not present

## 2017-09-09 DIAGNOSIS — R1013 Epigastric pain: Secondary | ICD-10-CM | POA: Diagnosis not present

## 2017-10-18 DIAGNOSIS — R1013 Epigastric pain: Secondary | ICD-10-CM | POA: Diagnosis not present

## 2017-10-18 DIAGNOSIS — Z720 Tobacco use: Secondary | ICD-10-CM | POA: Diagnosis not present

## 2017-10-18 DIAGNOSIS — E785 Hyperlipidemia, unspecified: Secondary | ICD-10-CM | POA: Diagnosis not present

## 2017-10-18 DIAGNOSIS — G629 Polyneuropathy, unspecified: Secondary | ICD-10-CM | POA: Diagnosis not present

## 2017-10-18 DIAGNOSIS — J309 Allergic rhinitis, unspecified: Secondary | ICD-10-CM | POA: Diagnosis not present

## 2018-02-17 DIAGNOSIS — Z0000 Encounter for general adult medical examination without abnormal findings: Secondary | ICD-10-CM | POA: Diagnosis not present

## 2018-02-17 DIAGNOSIS — Z0110 Encounter for examination of ears and hearing without abnormal findings: Secondary | ICD-10-CM | POA: Diagnosis not present

## 2018-02-17 DIAGNOSIS — Z131 Encounter for screening for diabetes mellitus: Secondary | ICD-10-CM | POA: Diagnosis not present

## 2018-02-17 DIAGNOSIS — Z0100 Encounter for examination of eyes and vision without abnormal findings: Secondary | ICD-10-CM | POA: Diagnosis not present

## 2018-03-10 DIAGNOSIS — J309 Allergic rhinitis, unspecified: Secondary | ICD-10-CM | POA: Diagnosis not present

## 2018-03-10 DIAGNOSIS — M79642 Pain in left hand: Secondary | ICD-10-CM | POA: Diagnosis not present

## 2018-03-10 DIAGNOSIS — R1013 Epigastric pain: Secondary | ICD-10-CM | POA: Diagnosis not present

## 2018-03-10 DIAGNOSIS — Z720 Tobacco use: Secondary | ICD-10-CM | POA: Diagnosis not present

## 2018-03-10 DIAGNOSIS — E785 Hyperlipidemia, unspecified: Secondary | ICD-10-CM | POA: Diagnosis not present

## 2018-03-24 ENCOUNTER — Ambulatory Visit (INDEPENDENT_AMBULATORY_CARE_PROVIDER_SITE_OTHER): Payer: Medicare Other | Admitting: Orthopaedic Surgery

## 2018-03-28 ENCOUNTER — Telehealth (INDEPENDENT_AMBULATORY_CARE_PROVIDER_SITE_OTHER): Payer: Self-pay | Admitting: Radiology

## 2018-03-28 NOTE — Telephone Encounter (Signed)
I called and advised that we moved his appt from [facility]'s sched to Dr. Vevelyn Royals sched.  Also he answered NO to all the COVID-19 questions

## 2018-03-30 ENCOUNTER — Other Ambulatory Visit (INDEPENDENT_AMBULATORY_CARE_PROVIDER_SITE_OTHER): Payer: Self-pay

## 2018-03-30 ENCOUNTER — Ambulatory Visit (INDEPENDENT_AMBULATORY_CARE_PROVIDER_SITE_OTHER): Payer: Medicare Other | Admitting: Orthopaedic Surgery

## 2018-03-30 ENCOUNTER — Encounter (INDEPENDENT_AMBULATORY_CARE_PROVIDER_SITE_OTHER): Payer: Self-pay | Admitting: Orthopaedic Surgery

## 2018-03-30 ENCOUNTER — Other Ambulatory Visit: Payer: Self-pay

## 2018-03-30 DIAGNOSIS — E7800 Pure hypercholesterolemia, unspecified: Secondary | ICD-10-CM | POA: Diagnosis not present

## 2018-03-30 DIAGNOSIS — R202 Paresthesia of skin: Secondary | ICD-10-CM

## 2018-03-30 DIAGNOSIS — J301 Allergic rhinitis due to pollen: Secondary | ICD-10-CM | POA: Diagnosis not present

## 2018-03-30 DIAGNOSIS — G629 Polyneuropathy, unspecified: Secondary | ICD-10-CM | POA: Diagnosis not present

## 2018-03-30 DIAGNOSIS — K219 Gastro-esophageal reflux disease without esophagitis: Secondary | ICD-10-CM | POA: Diagnosis not present

## 2018-03-30 DIAGNOSIS — Z0001 Encounter for general adult medical examination with abnormal findings: Secondary | ICD-10-CM | POA: Diagnosis not present

## 2018-03-30 DIAGNOSIS — M79642 Pain in left hand: Principal | ICD-10-CM

## 2018-03-30 DIAGNOSIS — M79641 Pain in right hand: Secondary | ICD-10-CM

## 2018-03-30 DIAGNOSIS — R200 Anesthesia of skin: Secondary | ICD-10-CM | POA: Diagnosis not present

## 2018-03-30 NOTE — Progress Notes (Signed)
Office Visit Note   Patient: Corey Jenkins           Date of Birth: [birth year]           MRN: [id number] Visit Date: 03/30/2018              Requested by: Alwyn Pea, MD [phone] [place], [place] [address] PCP: Alwyn Pea, MD   Assessment & Plan: Visit Diagnoses: No diagnosis found.  Plan: Thus far, his signs and symptoms seem to be consistent with carpal tunnel syndrome.  We are going to try him in a Velcro wrist splint and I would like to obtain bilateral upper extremity nerve conduction studies by Dr. Alvester Morin to assess for nerve compression or other sources for his radicular symptoms.  All questions and concerns were answered and addressed.  We will see him back once he has had his nerve conduction studies.  Follow-Up Instructions: Return in about 4 weeks (around 04/27/2018).   Orders:  No orders of the defined types were placed in this encounter.  No orders of the defined types were placed in this encounter.     Procedures: No procedures performed   Clinical Data: No additional findings.   Subjective: Chief Complaint  Patient presents with  . Left Hand - Pain  The patient is someone I seen in the past.  Is been sometime.  He comes in today with left hand pain with numbness and tingling is been going on for a while.  He says his left hand numbness does wake him up at night.  He denies being a diabetic.  He denies any injuries.  He is just only had some slight numbness on the right hand.  He is right-hand dominant.  He says that his left hand does feel weaker and he has a weaker grip strength.  He points to his middle index and ring fingers as the main source of numbness.  He has never had neck surgery and denies any neck issues.  Again he does say that the numbness can be worse at night on his left hand.  HPI  Review of Systems He currently denies any headache, chest pain, shortness of breath, fever, chills, nausea, vomiting  Objective: Vital  Signs: There were no vitals taken for this visit.  Physical Exam Is alert and orient x3 and in no acute distress Ortho Exam Examination of his left hand shows no muscle atrophy.  He does have a positive Phalen's and Tinel's exam.  His numbness seems to be definitely more median nerve related.  He has a well-healed surgical incision on the dorsal ulnar aspect of his thumb and he said this is from a previous injury years ago.  This was addressed by another hand surgeon in town. Specialty Comments:  No specialty comments available.  Imaging: No results found. X-rays of his left hand that accompany him that are independently reviewed are unremarkable.  PMFS History: Patient Active Problem List   Diagnosis Date Noted  . Incarcerated ventral hernia 04/10/2013   Past Medical History:  Diagnosis Date  . GERD (gastroesophageal reflux disease)     History reviewed. No pertinent family history.  Past Surgical History:  Procedure Laterality Date  . FOOT SURGERY    . HAND SURGERY    . L foot amputation     Social History   Occupational History  . Not on file  Tobacco Use  . Smoking status: Current Every Day Smoker    Packs/day: 1.00  Types: Cigarettes  . Smokeless tobacco: Never Used  Substance and Sexual Activity  . Alcohol use: Yes  . Drug use: No  . Sexual activity: Yes    Birth control/protection: Condom

## 2018-04-27 ENCOUNTER — Ambulatory Visit (INDEPENDENT_AMBULATORY_CARE_PROVIDER_SITE_OTHER): Payer: Medicare Other | Admitting: Orthopaedic Surgery

## 2018-07-19 DIAGNOSIS — Z720 Tobacco use: Secondary | ICD-10-CM | POA: Diagnosis not present

## 2018-07-19 DIAGNOSIS — E7800 Pure hypercholesterolemia, unspecified: Secondary | ICD-10-CM | POA: Diagnosis not present

## 2018-07-19 DIAGNOSIS — K219 Gastro-esophageal reflux disease without esophagitis: Secondary | ICD-10-CM | POA: Diagnosis not present

## 2018-07-19 DIAGNOSIS — J301 Allergic rhinitis due to pollen: Secondary | ICD-10-CM | POA: Diagnosis not present

## 2018-07-19 DIAGNOSIS — G629 Polyneuropathy, unspecified: Secondary | ICD-10-CM | POA: Diagnosis not present

## 2018-08-18 DIAGNOSIS — G629 Polyneuropathy, unspecified: Secondary | ICD-10-CM | POA: Diagnosis not present

## 2018-08-18 DIAGNOSIS — J301 Allergic rhinitis due to pollen: Secondary | ICD-10-CM | POA: Diagnosis not present

## 2018-08-18 DIAGNOSIS — E7800 Pure hypercholesterolemia, unspecified: Secondary | ICD-10-CM | POA: Diagnosis not present

## 2018-08-18 DIAGNOSIS — K219 Gastro-esophageal reflux disease without esophagitis: Secondary | ICD-10-CM | POA: Diagnosis not present

## 2018-08-18 DIAGNOSIS — Z720 Tobacco use: Secondary | ICD-10-CM | POA: Diagnosis not present

## 2019-02-12 DIAGNOSIS — Z0110 Encounter for examination of ears and hearing without abnormal findings: Secondary | ICD-10-CM | POA: Diagnosis not present

## 2019-02-12 DIAGNOSIS — Z0000 Encounter for general adult medical examination without abnormal findings: Secondary | ICD-10-CM | POA: Diagnosis not present

## 2019-02-12 DIAGNOSIS — Z131 Encounter for screening for diabetes mellitus: Secondary | ICD-10-CM | POA: Diagnosis not present

## 2019-02-12 DIAGNOSIS — K219 Gastro-esophageal reflux disease without esophagitis: Secondary | ICD-10-CM | POA: Diagnosis not present

## 2019-02-12 DIAGNOSIS — Z136 Encounter for screening for cardiovascular disorders: Secondary | ICD-10-CM | POA: Diagnosis not present

## 2019-02-12 DIAGNOSIS — Z0100 Encounter for examination of eyes and vision without abnormal findings: Secondary | ICD-10-CM | POA: Diagnosis not present

## 2019-04-17 DIAGNOSIS — K219 Gastro-esophageal reflux disease without esophagitis: Secondary | ICD-10-CM | POA: Diagnosis not present

## 2019-04-17 DIAGNOSIS — G629 Polyneuropathy, unspecified: Secondary | ICD-10-CM | POA: Diagnosis not present

## 2019-04-17 DIAGNOSIS — E7800 Pure hypercholesterolemia, unspecified: Secondary | ICD-10-CM | POA: Diagnosis not present

## 2019-04-17 DIAGNOSIS — N179 Acute kidney failure, unspecified: Secondary | ICD-10-CM | POA: Diagnosis not present

## 2019-04-17 DIAGNOSIS — Z0001 Encounter for general adult medical examination with abnormal findings: Secondary | ICD-10-CM | POA: Diagnosis not present

## 2019-04-26 DIAGNOSIS — N179 Acute kidney failure, unspecified: Secondary | ICD-10-CM | POA: Diagnosis not present

## 2019-04-26 DIAGNOSIS — E7800 Pure hypercholesterolemia, unspecified: Secondary | ICD-10-CM | POA: Diagnosis not present

## 2019-04-26 DIAGNOSIS — M79642 Pain in left hand: Secondary | ICD-10-CM | POA: Diagnosis not present

## 2019-04-26 DIAGNOSIS — G629 Polyneuropathy, unspecified: Secondary | ICD-10-CM | POA: Diagnosis not present

## 2019-04-26 DIAGNOSIS — K219 Gastro-esophageal reflux disease without esophagitis: Secondary | ICD-10-CM | POA: Diagnosis not present

## 2019-07-26 DIAGNOSIS — E7800 Pure hypercholesterolemia, unspecified: Secondary | ICD-10-CM | POA: Diagnosis not present

## 2019-07-26 DIAGNOSIS — Z720 Tobacco use: Secondary | ICD-10-CM | POA: Diagnosis not present

## 2019-07-26 DIAGNOSIS — K219 Gastro-esophageal reflux disease without esophagitis: Secondary | ICD-10-CM | POA: Diagnosis not present

## 2019-07-26 DIAGNOSIS — J301 Allergic rhinitis due to pollen: Secondary | ICD-10-CM | POA: Diagnosis not present

## 2019-07-26 DIAGNOSIS — G629 Polyneuropathy, unspecified: Secondary | ICD-10-CM | POA: Diagnosis not present

## 2019-08-01 ENCOUNTER — Emergency Department (HOSPITAL_COMMUNITY): Payer: Medicare Other

## 2019-08-01 ENCOUNTER — Encounter (HOSPITAL_COMMUNITY): Payer: Self-pay

## 2019-08-01 ENCOUNTER — Emergency Department (HOSPITAL_COMMUNITY)
Admission: EM | Admit: 2019-08-01 | Discharge: 2019-08-01 | Disposition: A | Discharge status: Home / Self Care | Payer: Medicare Other | Attending: Emergency Medicine | Admitting: Emergency Medicine

## 2019-08-01 ENCOUNTER — Other Ambulatory Visit: Payer: Self-pay

## 2019-08-01 DIAGNOSIS — M546 Pain in thoracic spine: Secondary | ICD-10-CM | POA: Diagnosis not present

## 2019-08-01 DIAGNOSIS — Y999 Unspecified external cause status: Secondary | ICD-10-CM | POA: Diagnosis not present

## 2019-08-01 DIAGNOSIS — Y92410 Unspecified street and highway as the place of occurrence of the external cause: Secondary | ICD-10-CM | POA: Insufficient documentation

## 2019-08-01 DIAGNOSIS — Y939 Activity, unspecified: Secondary | ICD-10-CM | POA: Insufficient documentation

## 2019-08-01 DIAGNOSIS — F17210 Nicotine dependence, cigarettes, uncomplicated: Secondary | ICD-10-CM | POA: Diagnosis not present

## 2019-08-01 DIAGNOSIS — M545 Low back pain: Secondary | ICD-10-CM | POA: Insufficient documentation

## 2019-08-01 DIAGNOSIS — M25512 Pain in left shoulder: Secondary | ICD-10-CM | POA: Diagnosis not present

## 2019-08-01 DIAGNOSIS — S4992XA Unspecified injury of left shoulder and upper arm, initial encounter: Secondary | ICD-10-CM | POA: Diagnosis not present

## 2019-08-01 DIAGNOSIS — M549 Dorsalgia, unspecified: Secondary | ICD-10-CM

## 2019-08-01 MED ORDER — IBUPROFEN 800 MG PO TABS
800.0000 mg | ORAL_TABLET | Freq: Once | ORAL | Status: AC
  Administered 2019-08-01: 800 mg via ORAL
  Filled 2019-08-01: qty 1

## 2019-08-01 MED ORDER — MELOXICAM 7.5 MG PO TABS: 7.5000 mg | ORAL_TABLET | Freq: Every day | ORAL | 0 refills | Status: DC

## 2019-08-01 MED ORDER — METHOCARBAMOL 500 MG PO TABS: 500.0000 mg | ORAL_TABLET | Freq: Two times a day (BID) | ORAL | 0 refills | Status: DC
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
## 2019-08-01 NOTE — ED Notes (Signed)
Pt discharged from this ED in stable condition at this time. All discharge instructions and follow up care reviewed with pt with no further questions at this time. Pt ambulatory with steady gait, clear speech.  
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
## 2019-08-01 NOTE — Discharge Instructions (Addendum)
Take Robaxin and Meloxicam as prescribed. Follow up with your doctor.  Apply warm compresses to sore muscles for 20 minutes at a time.
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
## 2019-08-07 DIAGNOSIS — K219 Gastro-esophageal reflux disease without esophagitis: Secondary | ICD-10-CM | POA: Diagnosis not present

## 2019-08-07 DIAGNOSIS — Z720 Tobacco use: Secondary | ICD-10-CM | POA: Diagnosis not present

## 2019-08-07 DIAGNOSIS — E7800 Pure hypercholesterolemia, unspecified: Secondary | ICD-10-CM | POA: Diagnosis not present

## 2019-08-07 DIAGNOSIS — J301 Allergic rhinitis due to pollen: Secondary | ICD-10-CM | POA: Diagnosis not present

## 2019-08-15 ENCOUNTER — Ambulatory Visit: Payer: Self-pay

## 2019-08-15 ENCOUNTER — Ambulatory Visit (INDEPENDENT_AMBULATORY_CARE_PROVIDER_SITE_OTHER): Payer: Medicare Other | Admitting: Physician Assistant

## 2019-08-15 ENCOUNTER — Encounter: Payer: Self-pay | Admitting: Physician Assistant

## 2019-08-15 DIAGNOSIS — M542 Cervicalgia: Secondary | ICD-10-CM

## 2019-08-15 DIAGNOSIS — M79672 Pain in left foot: Secondary | ICD-10-CM

## 2019-08-15 MED ORDER — METHYLPREDNISOLONE 4 MG PO TABS: ORAL_TABLET | ORAL | 0 refills | Status: DC

## 2019-08-15 MED ORDER — METHOCARBAMOL 500 MG PO TABS: 500.0000 mg | ORAL_TABLET | Freq: Two times a day (BID) | ORAL | 0 refills | Status: DC
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
## 2019-08-22 DIAGNOSIS — L84 Corns and callosities: Secondary | ICD-10-CM | POA: Diagnosis not present

## 2019-08-22 DIAGNOSIS — R2689 Other abnormalities of gait and mobility: Secondary | ICD-10-CM | POA: Diagnosis not present

## 2019-08-22 DIAGNOSIS — I739 Peripheral vascular disease, unspecified: Secondary | ICD-10-CM | POA: Diagnosis not present

## 2019-08-22 DIAGNOSIS — M2041 Other hammer toe(s) (acquired), right foot: Secondary | ICD-10-CM | POA: Diagnosis not present

## 2019-08-22 DIAGNOSIS — M21611 Bunion of right foot: Secondary | ICD-10-CM | POA: Diagnosis not present

## 2019-08-23 ENCOUNTER — Telehealth: Payer: Self-pay | Admitting: Orthopaedic Surgery

## 2019-08-23 NOTE — Telephone Encounter (Signed)
Sent to provided fax number

## 2019-08-23 NOTE — Telephone Encounter (Signed)
Corey Jenkins with [facility] called requesting we send over the pts medical records; Corey Jenkins states pt is there for appt so she would like this done as soon as possible   Fax# [phone] Corey Jenkins CB# [phone]

## 2019-08-30 ENCOUNTER — Telehealth: Payer: Self-pay | Admitting: Orthopaedic Surgery

## 2019-08-30 NOTE — Telephone Encounter (Signed)
Corey Jenkins from [facility] called. Patient has misplaced the RX for PT. She would like one faxed to [phone]. He has an appointment scheduled for 8/27.

## 2019-08-31 NOTE — Telephone Encounter (Signed)
faxed

## 2019-08-31 NOTE — Telephone Encounter (Signed)
This is sitting on my desk, maybe under that notebook, can you fax it for me?

## 2019-09-12 ENCOUNTER — Ambulatory Visit (INDEPENDENT_AMBULATORY_CARE_PROVIDER_SITE_OTHER): Payer: Medicare Other | Admitting: Physician Assistant

## 2019-09-12 ENCOUNTER — Encounter: Payer: Self-pay | Admitting: Physician Assistant

## 2019-09-12 DIAGNOSIS — M542 Cervicalgia: Secondary | ICD-10-CM | POA: Diagnosis not present

## 2019-09-12 DIAGNOSIS — R200 Anesthesia of skin: Secondary | ICD-10-CM | POA: Diagnosis not present

## 2019-09-12 DIAGNOSIS — R202 Paresthesia of skin: Secondary | ICD-10-CM | POA: Diagnosis not present

## 2019-09-12 NOTE — Progress Notes (Signed)
Office Visit Note   Patient: Corey Jenkins           Date of Birth: April 21, 1960           MRN: [id number] Visit Date: 09/12/2019              Requested by: No referring provider defined for this encounter. PCP: Patient, No Pcp Per   Assessment & Plan: Visit Diagnoses:  1. Cervicalgia   2. Numbness and tingling in left hand     Plan: Due to patient's continued neck pain with radicular symptoms right arm and also numbness tingling in the left hand recommend MRI of the cervical spine. The MRI is to rule out HNP as a source for the right upper extremity pain possibly with the left hand numbness and tingling however he may require EMG nerve studies in the future. Will follow up after the MRI.  Discontinue physical therapy for now.   Follow-Up Instructions: Return After MRI.   Orders:  No orders of the defined types were placed in this encounter.  No orders of the defined types were placed in this encounter.     Procedures: No procedures performed   Clinical Data: No additional findings.   Subjective: Chief Complaint  Patient presents with  . Neck - Follow-up  . Right Arm - Follow-up    HPI  Mr. Stmarie returns today follow-up of neck and arm pain.  He has been going to physical therapy and did finish his Medrol Dosepak.  He states that some days are better than others still having significant pain awakens him at night.  Most pain involves the right shoulder neck region.  Pain is no longer having numbness tingling in the right hand and arm now having numbness tingling in left hand.  Review of Systems  Constitutional: Negative for chills and fever.  Musculoskeletal: Positive for neck pain and neck stiffness.  Neurological: Positive for numbness.     Objective: Vital Signs: There were no vitals taken for this visit.  Physical Exam Constitutional:      Appearance: He is not ill-appearing or diaphoretic.  Pulmonary:     Effort: Pulmonary effort is normal.   Neurological:     Mental Status: He is alert and oriented to person, place, and time.  Psychiatric:        Mood and Affect: Mood normal.     Ortho Exam Strength testing upper extremities reveals normal strength except for the right biceps which he has 4-5 strength compared to 5 out of 5 on the left against resistance.  No evidence of biceps rupture proximally or distally t  Cervical spine he has pain and decreased range of motion with rotation to the right.  Has positive Spurling's.  Tenderness along the right medial border of the scapula.  No hands full motor subjective decreased sensation throughout the left hand.  Negative Phalen's negative Tinel's and compression test at the wrist bilaterally. Specialty Comments:  No specialty comments available.  Imaging: No results found.   PMFS History: Patient Active Problem List   Diagnosis Date Noted  . Incarcerated ventral hernia 04/10/2013   Past Medical History:  Diagnosis Date  . GERD (gastroesophageal reflux disease)     History reviewed. No pertinent family history.  Past Surgical History:  Procedure Laterality Date  . FOOT SURGERY    . HAND SURGERY    . L foot amputation     Social History   Occupational History  . Not on file  Tobacco Use  . Smoking status: Current Every Day Smoker    Packs/day: 1.00    Types: Cigarettes  . Smokeless tobacco: Never Used  Substance and Sexual Activity  . Alcohol use: Yes  . Drug use: No  . Sexual activity: Yes    Birth control/protection: Condom

## 2019-09-13 ENCOUNTER — Other Ambulatory Visit: Payer: Self-pay | Admitting: Radiology

## 2019-09-13 DIAGNOSIS — M79641 Pain in right hand: Secondary | ICD-10-CM

## 2019-09-13 DIAGNOSIS — R202 Paresthesia of skin: Secondary | ICD-10-CM

## 2019-09-13 DIAGNOSIS — R200 Anesthesia of skin: Secondary | ICD-10-CM

## 2019-09-13 DIAGNOSIS — M542 Cervicalgia: Secondary | ICD-10-CM

## 2019-09-25 ENCOUNTER — Ambulatory Visit
Admission: RE | Admit: 2019-09-25 | Discharge: 2019-09-25 | Disposition: A | Discharge status: Home / Self Care | Payer: Medicare Other | Source: Ambulatory Visit | Attending: Physician Assistant | Admitting: Physician Assistant

## 2019-09-25 DIAGNOSIS — M79641 Pain in right hand: Secondary | ICD-10-CM

## 2019-09-25 DIAGNOSIS — M542 Cervicalgia: Secondary | ICD-10-CM

## 2019-09-25 DIAGNOSIS — M4802 Spinal stenosis, cervical region: Secondary | ICD-10-CM | POA: Diagnosis not present

## 2019-10-03 ENCOUNTER — Ambulatory Visit (INDEPENDENT_AMBULATORY_CARE_PROVIDER_SITE_OTHER): Payer: Medicare Other | Admitting: Physician Assistant

## 2019-10-03 DIAGNOSIS — M4802 Spinal stenosis, cervical region: Secondary | ICD-10-CM

## 2019-10-03 DIAGNOSIS — M7541 Impingement syndrome of right shoulder: Secondary | ICD-10-CM

## 2019-10-03 MED ORDER — LIDOCAINE HCL 1 % IJ SOLN
3.0000 mL | INTRAMUSCULAR | Status: AC | PRN
  Administered 2019-10-03: 3 mL

## 2019-10-03 MED ORDER — METHYLPREDNISOLONE ACETATE 40 MG/ML IJ SUSP
40.0000 mg | INTRAMUSCULAR | Status: AC | PRN
  Administered 2019-10-03: 40 mg via INTRA_ARTICULAR
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
## 2019-10-04 ENCOUNTER — Other Ambulatory Visit: Payer: Self-pay | Admitting: Radiology

## 2019-10-04 DIAGNOSIS — R200 Anesthesia of skin: Secondary | ICD-10-CM

## 2019-10-04 DIAGNOSIS — M7541 Impingement syndrome of right shoulder: Secondary | ICD-10-CM

## 2019-10-04 DIAGNOSIS — M4802 Spinal stenosis, cervical region: Secondary | ICD-10-CM

## 2019-10-11 ENCOUNTER — Telehealth: Payer: Self-pay | Admitting: Physician Assistant

## 2019-10-11 NOTE — Telephone Encounter (Signed)
Left voicemail. Referral has been sent to [facility] [phone] However referral is still in review therefore office is not ready to schedule him yet, referral must be reviewed by provider at that office and once approved they will contact him to schedule.

## 2019-10-11 NOTE — Telephone Encounter (Signed)
Pt called stating the neurosurgeon still hasn't reached out so he would like for [place] to CB with the details so the pt can call and schedule his appt   [phone]

## 2019-10-12 DIAGNOSIS — R030 Elevated blood-pressure reading, without diagnosis of hypertension: Secondary | ICD-10-CM | POA: Diagnosis not present

## 2019-10-12 DIAGNOSIS — M47812 Spondylosis without myelopathy or radiculopathy, cervical region: Secondary | ICD-10-CM | POA: Diagnosis not present

## 2019-10-18 DIAGNOSIS — J301 Allergic rhinitis due to pollen: Secondary | ICD-10-CM | POA: Diagnosis not present

## 2019-10-18 DIAGNOSIS — E7800 Pure hypercholesterolemia, unspecified: Secondary | ICD-10-CM | POA: Diagnosis not present

## 2019-10-18 DIAGNOSIS — Z720 Tobacco use: Secondary | ICD-10-CM | POA: Diagnosis not present

## 2019-10-18 DIAGNOSIS — K219 Gastro-esophageal reflux disease without esophagitis: Secondary | ICD-10-CM | POA: Diagnosis not present

## 2019-10-18 DIAGNOSIS — H6691 Otitis media, unspecified, right ear: Secondary | ICD-10-CM | POA: Diagnosis not present

## 2019-10-19 DIAGNOSIS — Z89412 Acquired absence of left great toe: Secondary | ICD-10-CM | POA: Diagnosis not present

## 2019-10-19 DIAGNOSIS — Z89422 Acquired absence of other left toe(s): Secondary | ICD-10-CM | POA: Diagnosis not present

## 2019-11-08 DIAGNOSIS — G5603 Carpal tunnel syndrome, bilateral upper limbs: Secondary | ICD-10-CM | POA: Diagnosis not present

## 2019-11-08 DIAGNOSIS — G5623 Lesion of ulnar nerve, bilateral upper limbs: Secondary | ICD-10-CM | POA: Diagnosis not present

## 2019-12-04 DIAGNOSIS — G5603 Carpal tunnel syndrome, bilateral upper limbs: Secondary | ICD-10-CM | POA: Diagnosis not present

## 2019-12-04 DIAGNOSIS — M47812 Spondylosis without myelopathy or radiculopathy, cervical region: Secondary | ICD-10-CM | POA: Diagnosis not present

## 2019-12-04 DIAGNOSIS — R030 Elevated blood-pressure reading, without diagnosis of hypertension: Secondary | ICD-10-CM | POA: Diagnosis not present

## 2019-12-05 ENCOUNTER — Telehealth: Payer: Self-pay | Admitting: Physician Assistant

## 2019-12-05 NOTE — Telephone Encounter (Signed)
:   The patient only got a voicemail that identified if there is being his voicemail.  Left a message that if he continues to have pain in the shoulder and is seen neurosurgery and they ruled out the neck is the source of his pain we could always order an MRI of his shoulder as we discussed in the past.  He will call us and let us know if he wants the MRI ordered.

## 2019-12-05 NOTE — Telephone Encounter (Signed)
Pt called asking if Corey Jenkins can call him regarding his shoulder and if he is going to be released since there isn't any more treatment that can be done.

## 2019-12-06 DIAGNOSIS — S98312S Complete traumatic amputation of left midfoot, sequela: Secondary | ICD-10-CM | POA: Diagnosis not present

## 2019-12-11 ENCOUNTER — Telehealth: Payer: Self-pay | Admitting: *Deleted

## 2019-12-11 DIAGNOSIS — M7541 Impingement syndrome of right shoulder: Secondary | ICD-10-CM

## 2019-12-11 NOTE — Telephone Encounter (Signed)
Pt called stating he received Bronson Curb message about getting set up for MRI and pt wants to go ahead proceed with MRI shoulder due to still having pain. Please place order.

## 2019-12-12 NOTE — Telephone Encounter (Signed)
Order in chart

## 2019-12-12 NOTE — Addendum Note (Signed)
Addended by: Barbette Or on: 12/12/2019 01:54 PM   Modules accepted: Orders

## 2019-12-12 NOTE — Telephone Encounter (Signed)
Gil which shoulder and is this Mri with contrast?

## 2019-12-12 NOTE — Telephone Encounter (Signed)
Right shoulder MRI without contrast r/o RCT

## 2020-01-03 ENCOUNTER — Other Ambulatory Visit: Payer: Self-pay

## 2020-01-03 ENCOUNTER — Ambulatory Visit
Admission: RE | Admit: 2020-01-03 | Discharge: 2020-01-03 | Disposition: A | Discharge status: Home / Self Care | Payer: Medicare Other | Source: Ambulatory Visit | Attending: Physician Assistant | Admitting: Physician Assistant

## 2020-01-03 DIAGNOSIS — M25511 Pain in right shoulder: Secondary | ICD-10-CM | POA: Diagnosis not present

## 2020-01-03 DIAGNOSIS — M7541 Impingement syndrome of right shoulder: Secondary | ICD-10-CM

## 2020-01-07 ENCOUNTER — Encounter: Payer: Self-pay | Admitting: Physician Assistant

## 2020-01-07 ENCOUNTER — Ambulatory Visit (INDEPENDENT_AMBULATORY_CARE_PROVIDER_SITE_OTHER): Payer: Medicare Other | Admitting: Physician Assistant

## 2020-01-07 DIAGNOSIS — M7541 Impingement syndrome of right shoulder: Secondary | ICD-10-CM | POA: Diagnosis not present

## 2020-01-07 NOTE — Addendum Note (Signed)
Addended by: Barbette Or on: 01/07/2020 01:05 PM   Modules accepted: Orders
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
## 2020-01-08 ENCOUNTER — Telehealth: Payer: Self-pay | Admitting: Orthopaedic Surgery

## 2020-01-08 DIAGNOSIS — M7541 Impingement syndrome of right shoulder: Secondary | ICD-10-CM

## 2020-01-08 NOTE — Telephone Encounter (Signed)
New order put in.

## 2020-01-08 NOTE — Telephone Encounter (Signed)
Pt called stating he would like to have his referral switched to [facility] rehab and if he could he would like to get an appt for sometime next week. Pt would like a CB when this has been switched, please and thank you.   [phone]

## 2020-01-08 NOTE — Telephone Encounter (Signed)
noted 

## 2020-01-16 ENCOUNTER — Ambulatory Visit: Payer: Medicare Other | Admitting: Physical Therapy

## 2020-02-18 ENCOUNTER — Encounter: Payer: Self-pay | Admitting: Physician Assistant

## 2020-02-18 ENCOUNTER — Ambulatory Visit (INDEPENDENT_AMBULATORY_CARE_PROVIDER_SITE_OTHER): Payer: Medicare Other | Admitting: Physician Assistant

## 2020-02-18 DIAGNOSIS — M7541 Impingement syndrome of right shoulder: Secondary | ICD-10-CM | POA: Diagnosis not present

## 2020-02-18 DIAGNOSIS — M542 Cervicalgia: Secondary | ICD-10-CM

## 2020-02-18 NOTE — Progress Notes (Signed)
HPI: [name] returns today follow-up of his right shoulder.  He states physical therapy is ended as of last Friday.  He is doing a home exercise program.  He states that this is helped tremendously with his right shoulder pain.  He does occasionally have to take some Aleve for the pain in his right shoulder.  He also feels that therapy has helped with his neck pain.  Review of systems: Please see HPI otherwise negative  Physical exam: Right shoulder he has 5 out of 5 strength with external and internal rotation against resistance bilateral shoulders.  Impingement testing is negative on the right.  Hand he can test negative on the right.  He has full overhead activity of both shoulders without significant pain.  Impression: Right shoulder tendinopathy  Plan: He will continue his home exercise program as taught by therapy.  He is released from care but can always come back to see [place] if there is any questions concerns.  Questions were encouraged and answered at length.

## 2020-04-17 DIAGNOSIS — Z720 Tobacco use: Secondary | ICD-10-CM | POA: Diagnosis not present

## 2020-04-17 DIAGNOSIS — J301 Allergic rhinitis due to pollen: Secondary | ICD-10-CM | POA: Diagnosis not present

## 2020-04-17 DIAGNOSIS — G629 Polyneuropathy, unspecified: Secondary | ICD-10-CM | POA: Diagnosis not present

## 2020-04-17 DIAGNOSIS — Z0001 Encounter for general adult medical examination with abnormal findings: Secondary | ICD-10-CM | POA: Diagnosis not present

## 2020-04-17 DIAGNOSIS — K219 Gastro-esophageal reflux disease without esophagitis: Secondary | ICD-10-CM | POA: Diagnosis not present

## 2020-04-17 DIAGNOSIS — E7800 Pure hypercholesterolemia, unspecified: Secondary | ICD-10-CM | POA: Diagnosis not present

## 2020-07-17 DIAGNOSIS — Z0000 Encounter for general adult medical examination without abnormal findings: Secondary | ICD-10-CM | POA: Diagnosis not present

## 2020-07-17 DIAGNOSIS — Z720 Tobacco use: Secondary | ICD-10-CM | POA: Diagnosis not present

## 2020-07-17 DIAGNOSIS — J301 Allergic rhinitis due to pollen: Secondary | ICD-10-CM | POA: Diagnosis not present

## 2020-07-17 DIAGNOSIS — K219 Gastro-esophageal reflux disease without esophagitis: Secondary | ICD-10-CM | POA: Diagnosis not present

## 2020-07-17 DIAGNOSIS — G629 Polyneuropathy, unspecified: Secondary | ICD-10-CM | POA: Diagnosis not present

## 2020-07-17 DIAGNOSIS — K429 Umbilical hernia without obstruction or gangrene: Secondary | ICD-10-CM | POA: Diagnosis not present

## 2020-07-17 DIAGNOSIS — E7800 Pure hypercholesterolemia, unspecified: Secondary | ICD-10-CM | POA: Diagnosis not present

## 2020-07-17 DIAGNOSIS — Z131 Encounter for screening for diabetes mellitus: Secondary | ICD-10-CM | POA: Diagnosis not present

## 2020-08-06 DIAGNOSIS — K429 Umbilical hernia without obstruction or gangrene: Secondary | ICD-10-CM | POA: Diagnosis not present

## 2020-08-06 DIAGNOSIS — K219 Gastro-esophageal reflux disease without esophagitis: Secondary | ICD-10-CM | POA: Diagnosis not present

## 2020-08-06 DIAGNOSIS — J301 Allergic rhinitis due to pollen: Secondary | ICD-10-CM | POA: Diagnosis not present

## 2020-08-06 DIAGNOSIS — E7800 Pure hypercholesterolemia, unspecified: Secondary | ICD-10-CM | POA: Diagnosis not present

## 2020-08-06 DIAGNOSIS — Z720 Tobacco use: Secondary | ICD-10-CM | POA: Diagnosis not present

## 2020-08-06 DIAGNOSIS — G629 Polyneuropathy, unspecified: Secondary | ICD-10-CM | POA: Diagnosis not present

## 2020-08-06 DIAGNOSIS — R7303 Prediabetes: Secondary | ICD-10-CM | POA: Diagnosis not present

## 2020-10-17 DIAGNOSIS — G629 Polyneuropathy, unspecified: Secondary | ICD-10-CM | POA: Diagnosis not present

## 2020-10-17 DIAGNOSIS — E7800 Pure hypercholesterolemia, unspecified: Secondary | ICD-10-CM | POA: Diagnosis not present

## 2020-10-17 DIAGNOSIS — J301 Allergic rhinitis due to pollen: Secondary | ICD-10-CM | POA: Diagnosis not present

## 2020-10-17 DIAGNOSIS — K219 Gastro-esophageal reflux disease without esophagitis: Secondary | ICD-10-CM | POA: Diagnosis not present

## 2020-10-17 DIAGNOSIS — R7303 Prediabetes: Secondary | ICD-10-CM | POA: Diagnosis not present

## 2020-10-17 DIAGNOSIS — Z720 Tobacco use: Secondary | ICD-10-CM | POA: Diagnosis not present
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
## 2021-04-24 DIAGNOSIS — G629 Polyneuropathy, unspecified: Secondary | ICD-10-CM | POA: Diagnosis not present

## 2021-04-24 DIAGNOSIS — R7303 Prediabetes: Secondary | ICD-10-CM | POA: Diagnosis not present

## 2021-04-24 DIAGNOSIS — E7800 Pure hypercholesterolemia, unspecified: Secondary | ICD-10-CM | POA: Diagnosis not present

## 2021-04-24 DIAGNOSIS — K219 Gastro-esophageal reflux disease without esophagitis: Secondary | ICD-10-CM | POA: Diagnosis not present

## 2021-04-24 DIAGNOSIS — Z720 Tobacco use: Secondary | ICD-10-CM | POA: Diagnosis not present

## 2021-04-24 DIAGNOSIS — Z0000 Encounter for general adult medical examination without abnormal findings: Secondary | ICD-10-CM | POA: Diagnosis not present

## 2021-04-24 DIAGNOSIS — J301 Allergic rhinitis due to pollen: Secondary | ICD-10-CM | POA: Diagnosis not present

## 2021-05-25 ENCOUNTER — Ambulatory Visit (HOSPITAL_COMMUNITY)
Admission: RE | Admit: 2021-05-25 | Discharge: 2021-05-25 | Disposition: A | Discharge status: Home / Self Care | Payer: Medicare Other | Source: Ambulatory Visit | Attending: Internal Medicine | Admitting: Internal Medicine

## 2021-05-25 ENCOUNTER — Encounter (HOSPITAL_COMMUNITY): Payer: Self-pay | Admitting: Emergency Medicine

## 2021-05-25 ENCOUNTER — Ambulatory Visit (HOSPITAL_COMMUNITY)
Admission: EM | Admit: 2021-05-25 | Discharge: 2021-05-25 | Disposition: A | Discharge status: Home / Self Care | Payer: Medicare Other | Attending: Internal Medicine | Admitting: Internal Medicine

## 2021-05-25 DIAGNOSIS — M79662 Pain in left lower leg: Secondary | ICD-10-CM

## 2021-05-25 DIAGNOSIS — M79605 Pain in left leg: Secondary | ICD-10-CM | POA: Diagnosis not present

## 2021-05-25 DIAGNOSIS — M25572 Pain in left ankle and joints of left foot: Secondary | ICD-10-CM | POA: Diagnosis not present

## 2021-05-25 DIAGNOSIS — M25562 Pain in left knee: Secondary | ICD-10-CM | POA: Diagnosis not present

## 2021-05-25 NOTE — ED Triage Notes (Signed)
Pt is a present today with left ankle and leg pain. Pt states that he fell two weeks ago. Pt states that the pain is sharp and aching

## 2021-05-25 NOTE — ED Provider Notes (Signed)
[facility]    CSN: [id number] Arrival date & time: 05/25/21  0347      History   Chief Complaint Chief Complaint  Patient presents with   Leg Pain   Ankle Pain    HPI Corey Jenkins is a 61 y.o. male with a history of left TMA several years ago comes to urgent care with left ankle pain.  Pain has been ongoing for the past couple of weeks.  No falls or trauma.  Pain is of moderate severity, aggravated by bearing weight.  Pain radiates up the side of his leg.  He also complains of pain.  No known relieving factors.  Patient recently flew to [facility] and back for a graduation ceremony.  He denies any shortness of breath, chest pain or chest pressure.  No palpitations.  No fever or chills.  No erythema.  Patient denies any swelling of the left ankle.  He is able to bear weight on the left foot. HPI  Past Medical History:  Diagnosis Date   GERD (gastroesophageal reflux disease)     Patient Active Problem List   Diagnosis Date Noted   Incarcerated ventral hernia 04/10/2013    Past Surgical History:  Procedure Laterality Date   FOOT SURGERY     HAND SURGERY     L foot amputation         Home Medications    Prior to Admission medications   Medication Sig Start Date End Date Taking? Authorizing Provider  chlorhexidine gluconate, MEDLINE KIT, (PERIDEX) 0.12 % solution Use as directed 15 mLs in the mouth or throat 2 (two) times daily. Swish in mouth for 30 seconds then spit 10/14/15   Leeroy Cha O, PA-C  montelukast (SINGULAIR) 10 MG tablet Take 10 mg by mouth at bedtime.    [provider]  pantoprazole (PROTONIX) 40 MG tablet Take 40 mg by mouth daily. 06/05/19   [provider]    Family History History reviewed. No pertinent family history.  Social History Social History   Tobacco Use   Smoking status: Every Day    Packs/day: 1.00    Types: Cigarettes   Smokeless tobacco: Never  Substance Use Topics   Alcohol use: Yes   Drug use:  No     Allergies   Patient has no known allergies.   Review of Systems Review of Systems As per HPI  Physical Exam Triage Vital Signs ED Triage Vitals  Enc Vitals Group     BP 05/25/21 1018 (!) 142/93     Pulse Rate 05/25/21 1018 (!) 58     Resp 05/25/21 1018 18     Temp 05/25/21 1018 98.1 F (36.7 C)     Temp Source 05/25/21 1018 Oral     SpO2 05/25/21 1018 95 %     Weight --      Height --      Head Circumference --      Peak Flow --      Pain Score 05/25/21 1017 10     Pain Loc --      Pain Edu? --      Excl. in [place]? --    No data found.  Updated Vital Signs BP (!) 142/93   Pulse (!) 58   Temp 98.1 F (36.7 C) (Oral)   Resp 18   SpO2 95%   Visual Acuity Right Eye Distance:   Left Eye Distance:   Bilateral Distance:    Right Eye Near:  Left Eye Near:    Bilateral Near:     Physical Exam Vitals and nursing note reviewed.  Constitutional:      General: He is not in acute distress.    Appearance: He is not ill-appearing.  Cardiovascular:     Rate and Rhythm: Normal rate and regular rhythm.  Musculoskeletal:        General: Normal range of motion.     Comments: Left cough tenderness with no swelling.  Left foot TMA.  Tenderness on palpation over the lateral malleolus of the left leg.  Neurological:     Mental Status: He is alert.     UC Treatments / Results  Labs (all labs ordered are listed, but only abnormal results are displayed) Labs Reviewed - No data to display  EKG   Radiology LE VENOUS  Result Date: 05/25/2021  Lower Venous DVT Study Patient Name:  ASLAN HIMES  Date of Exam:   05/25/2021 Medical Rec #: [id number]     Accession #:    [id number] Date of Birth: 04/09/1960     Patient Gender: M Patient Age:   61 years Exam Location:  [facility] Procedure:      VAS [place] LOWER EXTREMITY VENOUS (DVT) Referring Phys: Alba Kriesel --------------------------------------------------------------------------------  Indications: Sharp pain  lateral left calf, two weeks s/p injury.  Comparison Study: No prior studies. Performing Technologist: Darlin Coco RDMS, RVT  Examination Guidelines: A complete evaluation includes B-mode imaging, spectral Doppler, color Doppler, and power Doppler as needed of all accessible portions of each vessel. Bilateral testing is considered an integral part of a complete examination. Limited examinations for reoccurring indications may be performed as noted. The reflux portion of the exam is performed with the patient in reverse Trendelenburg.  +-----+---------------+---------+-----------+----------+--------------+ RIGHTCompressibilityPhasicitySpontaneityPropertiesThrombus Aging +-----+---------------+---------+-----------+----------+--------------+ CFV  Full           Yes      Yes                                 +-----+---------------+---------+-----------+----------+--------------+   +---------+---------------+---------+-----------+----------+--------------+ LEFT     CompressibilityPhasicitySpontaneityPropertiesThrombus Aging +---------+---------------+---------+-----------+----------+--------------+ CFV      Full           Yes      Yes                                 +---------+---------------+---------+-----------+----------+--------------+ SFJ      Full                                                        +---------+---------------+---------+-----------+----------+--------------+ FV Prox  Full                                                        +---------+---------------+---------+-----------+----------+--------------+ FV Mid   Full                                                        +---------+---------------+---------+-----------+----------+--------------+  FV DistalFull                                                        +---------+---------------+---------+-----------+----------+--------------+ PFV      Full                                                         +---------+---------------+---------+-----------+----------+--------------+ POP      Full           Yes      Yes                                 +---------+---------------+---------+-----------+----------+--------------+ PTV      Full                                                        +---------+---------------+---------+-----------+----------+--------------+ PERO     Full                                                        +---------+---------------+---------+-----------+----------+--------------+ Gastroc  Full                                                        +---------+---------------+---------+-----------+----------+--------------+ ATV      Full                                                        +---------+---------------+---------+-----------+----------+--------------+    Summary: RIGHT: - No evidence of common femoral vein obstruction.  LEFT: - There is no evidence of deep vein thrombosis in the lower extremity.  - No cystic structure found in the popliteal fossa.  *See table(s) above for measurements and observations.    Preliminary     Procedures Procedures (including critical care time)  Medications Ordered in UC Medications - No data to display  Initial Impression / Assessment and Plan / UC Course  I have reviewed the triage vital signs and the nursing notes.  Pertinent labs & imaging results that were available during my care of the patient were reviewed by me and considered in my medical decision making (see chart for details).     1.  Left ankle/leg pain: DVT screen is negative Ibuprofen as needed for pain and/or fever Gentle range of motion exercises Icing of the left ankle We will call patient with recommendations if imaging results are abnormal. Return precautions given. Final Clinical Impressions(s) / UC Diagnoses  Final diagnoses:  Left leg pain     Discharge Instructions      Venous ultrasound has  been ordered to evaluate for blood clot in your leg.  If ultrasound of the left leg does not show blood clot then the pain could be a result of osteoarthritis in the left ankle Icing of the left ankle Gentle range of motion exercises and padding of issues will help with pain/discomfort Return to urgent care if symptoms worsen.   ED Prescriptions   None    PDMP not reviewed this encounter.   Chase Picket, MD 05/25/21 1723

## 2021-05-25 NOTE — Discharge Instructions (Signed)
Venous ultrasound has been ordered to evaluate for blood clot in your leg.  If ultrasound of the left leg does not show blood clot then the pain could be a result of osteoarthritis in the left ankle Icing of the left ankle Gentle range of motion exercises and padding of issues will help with pain/discomfort Return to urgent care if symptoms worsen.
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
## 2021-07-31 DIAGNOSIS — J301 Allergic rhinitis due to pollen: Secondary | ICD-10-CM | POA: Diagnosis not present

## 2021-07-31 DIAGNOSIS — Z0000 Encounter for general adult medical examination without abnormal findings: Secondary | ICD-10-CM | POA: Diagnosis not present

## 2021-07-31 DIAGNOSIS — E7800 Pure hypercholesterolemia, unspecified: Secondary | ICD-10-CM | POA: Diagnosis not present

## 2021-07-31 DIAGNOSIS — K219 Gastro-esophageal reflux disease without esophagitis: Secondary | ICD-10-CM | POA: Diagnosis not present

## 2021-07-31 DIAGNOSIS — G629 Polyneuropathy, unspecified: Secondary | ICD-10-CM | POA: Diagnosis not present

## 2021-07-31 DIAGNOSIS — Z720 Tobacco use: Secondary | ICD-10-CM | POA: Diagnosis not present

## 2021-07-31 DIAGNOSIS — R7303 Prediabetes: Secondary | ICD-10-CM | POA: Diagnosis not present

## 2021-08-10 DIAGNOSIS — Z1212 Encounter for screening for malignant neoplasm of rectum: Secondary | ICD-10-CM | POA: Diagnosis not present

## 2021-08-10 DIAGNOSIS — Z1211 Encounter for screening for malignant neoplasm of colon: Secondary | ICD-10-CM | POA: Diagnosis not present

## 2021-08-18 LAB — EXTERNAL GENERIC LAB PROCEDURE: COLOGUARD: NEGATIVE

## 2021-08-18 LAB — COLOGUARD: COLOGUARD: NEGATIVE

## 2022-02-05 DIAGNOSIS — J301 Allergic rhinitis due to pollen: Secondary | ICD-10-CM | POA: Diagnosis not present

## 2022-02-05 DIAGNOSIS — G629 Polyneuropathy, unspecified: Secondary | ICD-10-CM | POA: Diagnosis not present

## 2022-02-05 DIAGNOSIS — Z720 Tobacco use: Secondary | ICD-10-CM | POA: Diagnosis not present

## 2022-02-05 DIAGNOSIS — M5441 Lumbago with sciatica, right side: Secondary | ICD-10-CM | POA: Diagnosis not present

## 2022-02-05 DIAGNOSIS — K219 Gastro-esophageal reflux disease without esophagitis: Secondary | ICD-10-CM | POA: Diagnosis not present

## 2022-02-05 DIAGNOSIS — Q254 Congenital malformation of aorta unspecified: Secondary | ICD-10-CM | POA: Diagnosis not present

## 2022-02-05 DIAGNOSIS — M5442 Lumbago with sciatica, left side: Secondary | ICD-10-CM | POA: Diagnosis not present

## 2022-02-05 DIAGNOSIS — R7303 Prediabetes: Secondary | ICD-10-CM | POA: Diagnosis not present

## 2022-02-05 DIAGNOSIS — E7800 Pure hypercholesterolemia, unspecified: Secondary | ICD-10-CM | POA: Diagnosis not present

## 2022-02-25 ENCOUNTER — Other Ambulatory Visit: Payer: Self-pay | Admitting: *Deleted

## 2022-02-25 DIAGNOSIS — I714 Abdominal aortic aneurysm, without rupture, unspecified: Secondary | ICD-10-CM
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
## 2022-03-05 ENCOUNTER — Encounter: Payer: Self-pay | Admitting: Vascular Surgery

## 2022-03-05 ENCOUNTER — Ambulatory Visit (HOSPITAL_COMMUNITY)
Admission: RE | Admit: 2022-03-05 | Discharge: 2022-03-05 | Disposition: A | Discharge status: Home / Self Care | Payer: Medicare Other | Source: Ambulatory Visit | Attending: Vascular Surgery | Admitting: Vascular Surgery

## 2022-03-05 ENCOUNTER — Ambulatory Visit: Payer: Medicare Other | Admitting: Vascular Surgery

## 2022-03-05 VITALS — BP 120/79 | HR 74 | Temp 98.3°F | Resp 20 | Ht 73.0 in | Wt 215.0 lb

## 2022-03-05 DIAGNOSIS — M79604 Pain in right leg: Secondary | ICD-10-CM | POA: Diagnosis not present

## 2022-03-05 DIAGNOSIS — I714 Abdominal aortic aneurysm, without rupture, unspecified: Secondary | ICD-10-CM | POA: Diagnosis not present

## 2022-07-28 DIAGNOSIS — J301 Allergic rhinitis due to pollen: Secondary | ICD-10-CM | POA: Diagnosis not present

## 2022-07-28 DIAGNOSIS — Z720 Tobacco use: Secondary | ICD-10-CM | POA: Diagnosis not present

## 2022-07-28 DIAGNOSIS — R7303 Prediabetes: Secondary | ICD-10-CM | POA: Diagnosis not present

## 2022-07-28 DIAGNOSIS — Z0001 Encounter for general adult medical examination with abnormal findings: Secondary | ICD-10-CM | POA: Diagnosis not present

## 2022-07-28 DIAGNOSIS — Q254 Congenital malformation of aorta unspecified: Secondary | ICD-10-CM | POA: Diagnosis not present

## 2022-07-28 DIAGNOSIS — K219 Gastro-esophageal reflux disease without esophagitis: Secondary | ICD-10-CM | POA: Diagnosis not present

## 2022-07-28 DIAGNOSIS — E7800 Pure hypercholesterolemia, unspecified: Secondary | ICD-10-CM | POA: Diagnosis not present

## 2022-09-14 DIAGNOSIS — E7800 Pure hypercholesterolemia, unspecified: Secondary | ICD-10-CM | POA: Diagnosis not present

## 2022-09-14 DIAGNOSIS — K4090 Unilateral inguinal hernia, without obstruction or gangrene, not specified as recurrent: Secondary | ICD-10-CM | POA: Diagnosis not present

## 2022-09-14 DIAGNOSIS — K219 Gastro-esophageal reflux disease without esophagitis: Secondary | ICD-10-CM | POA: Diagnosis not present

## 2022-09-14 DIAGNOSIS — Z23 Encounter for immunization: Secondary | ICD-10-CM | POA: Diagnosis not present

## 2022-09-14 DIAGNOSIS — Q254 Congenital malformation of aorta unspecified: Secondary | ICD-10-CM | POA: Diagnosis not present

## 2022-09-14 DIAGNOSIS — Z0001 Encounter for general adult medical examination with abnormal findings: Secondary | ICD-10-CM | POA: Diagnosis not present

## 2022-09-14 DIAGNOSIS — Z136 Encounter for screening for cardiovascular disorders: Secondary | ICD-10-CM | POA: Diagnosis not present

## 2022-09-14 DIAGNOSIS — Z720 Tobacco use: Secondary | ICD-10-CM | POA: Diagnosis not present

## 2022-09-14 DIAGNOSIS — R7303 Prediabetes: Secondary | ICD-10-CM | POA: Diagnosis not present

## 2022-09-14 DIAGNOSIS — J301 Allergic rhinitis due to pollen: Secondary | ICD-10-CM | POA: Diagnosis not present

## 2022-10-15 ENCOUNTER — Other Ambulatory Visit: Payer: Self-pay | Admitting: General Surgery

## 2022-10-15 DIAGNOSIS — K439 Ventral hernia without obstruction or gangrene: Secondary | ICD-10-CM | POA: Diagnosis not present

## 2022-10-15 DIAGNOSIS — K4090 Unilateral inguinal hernia, without obstruction or gangrene, not specified as recurrent: Secondary | ICD-10-CM | POA: Diagnosis not present

## 2022-12-06 ENCOUNTER — Ambulatory Visit
Admission: RE | Admit: 2022-12-06 | Discharge: 2022-12-06 | Disposition: A | Discharge status: Home / Self Care | Payer: Medicare Other | Source: Ambulatory Visit | Attending: General Surgery | Admitting: General Surgery

## 2022-12-06 DIAGNOSIS — K439 Ventral hernia without obstruction or gangrene: Secondary | ICD-10-CM

## 2022-12-06 DIAGNOSIS — K4090 Unilateral inguinal hernia, without obstruction or gangrene, not specified as recurrent: Secondary | ICD-10-CM | POA: Diagnosis not present

## 2022-12-06 DIAGNOSIS — I700 Atherosclerosis of aorta: Secondary | ICD-10-CM | POA: Diagnosis not present

## 2022-12-06 MED ORDER — IOPAMIDOL (ISOVUE-300) INJECTION 61%
500.0000 mL | Freq: Once | INTRAVENOUS | Status: AC | PRN
  Administered 2022-12-06: 100 mL via INTRAVENOUS

## 2022-12-08 NOTE — Progress Notes (Signed)
Pt called to cancel surgery and PAT appointment. RN explained that we can cancel his PAT appointment but he needs to call MD office to cancel surgery. Pt said he called the office and MD office said to call hospital to cancel. He was "tired of calling people." He followed up with "you're the last person I am telling. If they don't cancel, I'll just not show up morning of surgery and they will have to deal with it."  RN called OR scheduler to notify of pts request to cancel. OR scheduler said she would reach out to office to let them know.

## 2022-12-09 ENCOUNTER — Inpatient Hospital Stay (HOSPITAL_COMMUNITY): Admission: RE | Admit: 2022-12-09 | Payer: Medicare Other | Source: Ambulatory Visit

## 2022-12-14 ENCOUNTER — Ambulatory Visit (HOSPITAL_COMMUNITY): Admit: 2022-12-14 | Payer: Medicare Other | Admitting: General Surgery

## 2022-12-14 SURGERY — XI ROBOTIC ASSISTED VENTRAL HERNIA
Anesthesia: General

## 2023-02-09 DIAGNOSIS — R7303 Prediabetes: Secondary | ICD-10-CM | POA: Diagnosis not present

## 2023-02-09 DIAGNOSIS — B349 Viral infection, unspecified: Secondary | ICD-10-CM | POA: Diagnosis not present

## 2023-02-09 DIAGNOSIS — E7800 Pure hypercholesterolemia, unspecified: Secondary | ICD-10-CM | POA: Diagnosis not present

## 2023-02-09 DIAGNOSIS — J301 Allergic rhinitis due to pollen: Secondary | ICD-10-CM | POA: Diagnosis not present

## 2023-02-09 DIAGNOSIS — K219 Gastro-esophageal reflux disease without esophagitis: Secondary | ICD-10-CM | POA: Diagnosis not present

## 2023-02-09 DIAGNOSIS — Z720 Tobacco use: Secondary | ICD-10-CM | POA: Diagnosis not present

## 2023-04-13 DIAGNOSIS — R718 Other abnormality of red blood cells: Secondary | ICD-10-CM | POA: Diagnosis not present

## 2023-04-13 DIAGNOSIS — K219 Gastro-esophageal reflux disease without esophagitis: Secondary | ICD-10-CM | POA: Diagnosis not present

## 2023-04-13 DIAGNOSIS — J301 Allergic rhinitis due to pollen: Secondary | ICD-10-CM | POA: Diagnosis not present

## 2023-04-13 DIAGNOSIS — Z720 Tobacco use: Secondary | ICD-10-CM | POA: Diagnosis not present

## 2023-04-13 DIAGNOSIS — M1611 Unilateral primary osteoarthritis, right hip: Secondary | ICD-10-CM | POA: Diagnosis not present

## 2023-04-13 DIAGNOSIS — E7800 Pure hypercholesterolemia, unspecified: Secondary | ICD-10-CM | POA: Diagnosis not present

## 2023-04-13 DIAGNOSIS — R7303 Prediabetes: Secondary | ICD-10-CM | POA: Diagnosis not present

## 2023-07-28 DIAGNOSIS — Z720 Tobacco use: Secondary | ICD-10-CM | POA: Diagnosis not present

## 2023-07-28 DIAGNOSIS — K219 Gastro-esophageal reflux disease without esophagitis: Secondary | ICD-10-CM | POA: Diagnosis not present

## 2023-07-28 DIAGNOSIS — R7303 Prediabetes: Secondary | ICD-10-CM | POA: Diagnosis not present

## 2023-07-28 DIAGNOSIS — Z0001 Encounter for general adult medical examination with abnormal findings: Secondary | ICD-10-CM | POA: Diagnosis not present

## 2023-07-28 DIAGNOSIS — L309 Dermatitis, unspecified: Secondary | ICD-10-CM | POA: Diagnosis not present

## 2023-07-28 DIAGNOSIS — J301 Allergic rhinitis due to pollen: Secondary | ICD-10-CM | POA: Diagnosis not present

## 2023-07-28 DIAGNOSIS — E7800 Pure hypercholesterolemia, unspecified: Secondary | ICD-10-CM | POA: Diagnosis not present

## 2023-09-13 DIAGNOSIS — K219 Gastro-esophageal reflux disease without esophagitis: Secondary | ICD-10-CM | POA: Diagnosis not present

## 2023-09-13 DIAGNOSIS — J301 Allergic rhinitis due to pollen: Secondary | ICD-10-CM | POA: Diagnosis not present

## 2023-09-13 DIAGNOSIS — Z720 Tobacco use: Secondary | ICD-10-CM | POA: Diagnosis not present

## 2023-09-13 DIAGNOSIS — E7800 Pure hypercholesterolemia, unspecified: Secondary | ICD-10-CM | POA: Diagnosis not present

## 2023-09-13 DIAGNOSIS — K4090 Unilateral inguinal hernia, without obstruction or gangrene, not specified as recurrent: Secondary | ICD-10-CM | POA: Diagnosis not present

## 2023-09-13 DIAGNOSIS — R7303 Prediabetes: Secondary | ICD-10-CM | POA: Diagnosis not present

## 2023-09-13 DIAGNOSIS — Z0001 Encounter for general adult medical examination with abnormal findings: Secondary | ICD-10-CM | POA: Diagnosis not present

## 2023-10-11 DIAGNOSIS — J301 Allergic rhinitis due to pollen: Secondary | ICD-10-CM | POA: Diagnosis not present

## 2023-10-11 DIAGNOSIS — K219 Gastro-esophageal reflux disease without esophagitis: Secondary | ICD-10-CM | POA: Diagnosis not present

## 2023-10-11 DIAGNOSIS — K4090 Unilateral inguinal hernia, without obstruction or gangrene, not specified as recurrent: Secondary | ICD-10-CM | POA: Diagnosis not present

## 2023-10-11 DIAGNOSIS — E7800 Pure hypercholesterolemia, unspecified: Secondary | ICD-10-CM | POA: Diagnosis not present

## 2024-02-02 ENCOUNTER — Other Ambulatory Visit: Payer: Self-pay | Admitting: Anesthesiology

## 2024-02-02 DIAGNOSIS — M542 Cervicalgia: Secondary | ICD-10-CM

## 2024-02-02 DIAGNOSIS — M545 Low back pain, unspecified: Secondary | ICD-10-CM

## 2024-02-26 ENCOUNTER — Other Ambulatory Visit
